# Patient Record
Sex: Female | Born: 1975 | Race: White | Hispanic: No | Marital: Single | Smoking: Current every day smoker
Health system: Southern US, Community
[De-identification: ages and names within clinical notes are randomized; demographics above are authoritative.]

## PROBLEM LIST (undated history)

## (undated) DIAGNOSIS — G43909 Migraine, unspecified, not intractable, without status migrainosus: Secondary | ICD-10-CM

## (undated) DIAGNOSIS — H9190 Unspecified hearing loss, unspecified ear: Secondary | ICD-10-CM

## (undated) HISTORY — DX: Migraine, unspecified, not intractable, without status migrainosus: G43.909

## (undated) HISTORY — PX: OTHER SURGICAL HISTORY: SHX169

## (undated) HISTORY — PX: TONSILLECTOMY AND ADENOIDECTOMY: SUR1326

## (undated) HISTORY — PX: CHOLECYSTECTOMY: SHX55

## (undated) HISTORY — DX: Unspecified hearing loss, unspecified ear: H91.90

## (undated) HISTORY — PX: DILATION AND CURETTAGE OF UTERUS: SHX78

---

## 2020-02-06 ENCOUNTER — Emergency Department (INDEPENDENT_AMBULATORY_CARE_PROVIDER_SITE_OTHER): Payer: Self-pay

## 2020-02-06 ENCOUNTER — Emergency Department (INDEPENDENT_AMBULATORY_CARE_PROVIDER_SITE_OTHER)
Admission: EM | Admit: 2020-02-06 | Discharge: 2020-02-06 | Disposition: A | Discharge status: Home / Self Care | Payer: Self-pay | Source: Home / Self Care

## 2020-02-06 DIAGNOSIS — W010XXA Fall on same level from slipping, tripping and stumbling without subsequent striking against object, initial encounter: Secondary | ICD-10-CM

## 2020-02-06 DIAGNOSIS — S60416A Abrasion of right little finger, initial encounter: Secondary | ICD-10-CM

## 2020-02-06 DIAGNOSIS — M79641 Pain in right hand: Secondary | ICD-10-CM

## 2020-02-06 DIAGNOSIS — Y990 Civilian activity done for income or pay: Secondary | ICD-10-CM

## 2020-02-06 DIAGNOSIS — S63616A Unspecified sprain of right little finger, initial encounter: Secondary | ICD-10-CM

## 2020-02-06 DIAGNOSIS — S60414A Abrasion of right ring finger, initial encounter: Secondary | ICD-10-CM

## 2020-02-06 DIAGNOSIS — S60511A Abrasion of right hand, initial encounter: Secondary | ICD-10-CM

## 2020-02-06 NOTE — ED Triage Notes (Signed)
Pt c/o pain in her RT hand, 4th and pinkie fingers. Says she heard two snaps when she fell about 430 while at work. Has previously broken those fingers. Pain 7/10

## 2020-02-06 NOTE — Discharge Instructions (Signed)
  Keep wounds clean with warm water and mild soap. Change bandage 1-2 times daily to help keep protected as the wounds heal.  You may take tylenol and motrin as needed for pain.  Follow up with employee health and wellness at [facility] as needed.

## 2020-02-06 NOTE — ED Provider Notes (Signed)
[facility]    CSN: [id number] Arrival date & time: 02/06/20  1752      History   Chief Complaint Chief Complaint  Patient presents with  . Hand Pain    4th and pinkie finger    HPI Denise Mckinney is a 45 y.o. female.   HPI  Denise Mckinney is a 45 y.o. female presenting to UC with c/o Right hand pain, worse in little finger and ring finger following trip and fall at work, landing on cement. She has abrasions to tips of her fingers and hands. Denies hitting her head during the fall. Pain is 7/10. Hx of fracture in same fingers about 6 years ago from a fall.    History reviewed. No pertinent past medical history.  There are no problems to display for this patient.   History reviewed. No pertinent surgical history.  OB History   No obstetric history on file.      Home Medications    Prior to Admission medications   Not on File    Family History History reviewed. No pertinent family history.  Social History Social History   Tobacco Use  . Smoking status: Current Every Day Smoker    Packs/day: 0.50  . Smokeless tobacco: Never Used  Vaping Use  . Vaping Use: Never used  Substance Use Topics  . Alcohol use: Yes    Comment: occ     Allergies   Patient has no known allergies.   Review of Systems Review of Systems  Musculoskeletal: Positive for arthralgias and joint swelling.  Skin: Positive for wound. Negative for color change.  Neurological: Negative for dizziness, weakness, light-headedness, numbness and headaches.     Physical Exam Triage Vital Signs ED Triage Vitals  Enc Vitals Group     BP 02/06/20 1806 137/83     Pulse Rate 02/06/20 1806 71     Resp 02/06/20 1806 18     Temp 02/06/20 1806 97.7 F (36.5 C)     Temp Source 02/06/20 1806 Oral     SpO2 02/06/20 1806 98 %     Weight --      Height --      Head Circumference --      Peak Flow --      Pain Score 02/06/20 1807 7     Pain Loc --      Pain Edu? --      Excl. in [place]?  --    No data found.  Updated Vital Signs BP 137/83 (BP Location: Right Arm)   Pulse 71   Temp 97.7 F (36.5 C) (Oral)   Resp 18   SpO2 98%   Visual Acuity Right Eye Distance:   Left Eye Distance:   Bilateral Distance:    Right Eye Near:   Left Eye Near:    Bilateral Near:     Physical Exam Vitals and nursing note reviewed.  Constitutional:      Appearance: Normal appearance. She is well-developed and well-nourished.  HENT:     Head: Normocephalic and atraumatic.  Eyes:     Extraocular Movements: EOM normal.  Cardiovascular:     Rate and Rhythm: Normal rate and regular rhythm.     Pulses:          Radial pulses are 2+ on the right side.  Pulmonary:     Effort: Pulmonary effort is normal.  Musculoskeletal:        General: Tenderness present. No swelling. Normal range of motion.  Cervical back: Normal range of motion.     Comments: Right wrist: non-tender, full ROM. Right hand: mild tenderness over fourth and fifth metacarpals, little finger and ring finger. Full ROM, increased pain with full flexion of little and ring fingers.  Skin:    General: Skin is warm and dry.     Capillary Refill: Capillary refill takes less than 2 seconds.     Comments: Right hand: multiple superficial abrasions including tips of little finger and ring finger.   Neurological:     Mental Status: She is alert and oriented to person, place, and time.     Sensory: No sensory deficit.  Psychiatric:        Mood and Affect: Mood and affect normal.        Behavior: Behavior normal.      UC Treatments / Results  Labs (all labs ordered are listed, but only abnormal results are displayed) Labs Reviewed - No data to display  EKG   Radiology DG Hand Complete Right  Result Date: 02/06/2020 CLINICAL DATA:  45 year old female with fall and trauma to the right hand. EXAM: RIGHT HAND - COMPLETE 3+ VIEW COMPARISON:  None. FINDINGS: There is no acute fracture or dislocation. The bones are  osteopenic. The soft tissues are unremarkable. IMPRESSION: No acute fracture or dislocation. Electronically Signed   By: Anner Crete M.D.   On: 02/06/2020 18:40    Procedures Procedures (including critical care time)  Medications Ordered in UC Medications - No data to display  Initial Impression / Assessment and Plan / UC Course  I have reviewed the triage vital signs and the nursing notes.  Pertinent labs & imaging results that were available during my care of the patient were reviewed by me and considered in my medical decision making (see chart for details).     Discussed imaging with pt Will tx as finger sprain of little finger where pain is worst. Fingers strapped using buddy taping technique F/u with Employee Health as needed AVS given  Final Clinical Impressions(s) / UC Diagnoses   Final diagnoses:  Fall from slip, trip, or stumble, initial encounter  Abrasion of right little finger, initial encounter  Abrasion of right ring finger, initial encounter  Abrasion of right hand, initial encounter  Sprain of right little finger, initial encounter  Work related injury     Discharge Instructions      Keep wounds clean with warm water and mild soap. Change bandage 1-2 times daily to help keep protected as the wounds heal.  You may take tylenol and motrin as needed for pain.  Follow up with employee health and wellness at [facility] as needed.    ED Prescriptions    None     PDMP not reviewed this encounter.   Noe Gens, [place] 02/06/20 2007

## 2020-02-27 ENCOUNTER — Other Ambulatory Visit: Payer: Self-pay

## 2020-02-27 ENCOUNTER — Encounter: Payer: Self-pay | Admitting: Nurse Practitioner

## 2020-02-27 ENCOUNTER — Ambulatory Visit (INDEPENDENT_AMBULATORY_CARE_PROVIDER_SITE_OTHER): Payer: 59 | Admitting: Nurse Practitioner

## 2020-02-27 VITALS — BP 114/71 | HR 63 | Temp 98.3°F | Ht 62.0 in | Wt 119.4 lb

## 2020-02-27 DIAGNOSIS — G43009 Migraine without aura, not intractable, without status migrainosus: Secondary | ICD-10-CM | POA: Diagnosis not present

## 2020-02-27 DIAGNOSIS — Z8669 Personal history of other diseases of the nervous system and sense organs: Secondary | ICD-10-CM | POA: Insufficient documentation

## 2020-02-27 NOTE — Assessment & Plan Note (Addendum)
Patient has a history of migraine without aura and without status migrainous, not intractable. No new migraine symptoms.  Patient well managed with over-the-counter pain medication.

## 2020-02-27 NOTE — Assessment & Plan Note (Signed)
Bilateral history of hearing loss is a chronic symptom for patient. Patient does not use hearing aids. Patient has been referred to ENT for reassessment.  Follow-up with worsening or unresolved symptoms.

## 2020-02-27 NOTE — Patient Instructions (Signed)
Make appointment for complete physical exam on your next visit.  We will be doing a Pap, orders for mammograms, and labs-CBC, CMP, lipid panel and  hepatitis C,  Hearing Loss Hearing loss is a partial or total loss of the ability to hear. This can be temporary or permanent, and it can happen in one or both ears. Medical care is necessary to treat hearing loss properly and to prevent the condition from getting worse. Your hearing may partially or completely come back, depending on what caused your hearing loss and how severe it is. In some cases, hearing loss is permanent. What are the causes? Common causes of hearing loss include:  Too much wax in the ear canal.  Infection of the ear canal or middle ear.  Fluid in the middle ear.  Injury to the ear or surrounding area.  An object stuck in the ear.  A history of prolonged exposure to loud sounds, such as music. Less common causes of hearing loss include:  Tumors in the ear.  Viral or bacterial infections, such as meningitis.  A hole in the eardrum (perforated eardrum).  Problems with the hearing nerve that sends signals between the brain and the ear.  Certain medicines. What are the signs or symptoms? Symptoms of this condition may include:  Difficulty telling the difference between sounds.  Difficulty following a conversation when there is background noise.  Lack of response to sounds in your environment. This may be most noticeable when you do not respond to startling sounds.  Needing to turn up the volume on the television, radio, or other devices.  Ringing in the ears.  Dizziness. How is this diagnosed? This condition is diagnosed based on:  A physical exam.  A hearing test (audiometry). The audiometry test will be performed by a hearing specialist (audiologist). You may also be referred to an ear, nose, and throat (ENT) specialist (otolaryngologist).   How is this treated? Treatment for hearing loss may  include:  Ear wax removal.  Medicines to treat or prevent infection (antibiotics).  Medicines to reduce inflammation (corticosteroids).  Hearing aids for hearing loss related to nerve damage. Follow these instructions at home:  If you were prescribed an antibiotic medicine, take it as told by your health care provider. Do not stop taking the antibiotic even if you start to feel better.  Take over-the-counter and prescription medicines only as told by your health care provider.  Avoid loud noises.  Return to your normal activities as told by your health care provider. Ask your health care provider what activities are safe for you.  Keep all follow-up visits as told by your health care provider. This is important. Contact a health care provider if:  You feel dizzy.  You develop new symptoms.  You vomit or feel nauseous.  You have a fever. Get help right away if:  You develop sudden changes in your vision.  You have severe ear pain.  You have new or increased weakness.  You have a severe headache. Summary  Hearing loss is a decreased ability to hear sounds around you. It can be temporary or permanent.  Treatment will depend on the cause of your hearing loss. It may include ear wax removal, medicines, or a hearing aid.  Your hearing may partially or completely come back, depending on what caused your hearing loss and how severe it is.  Keep all follow-up visits as told by your health care provider. This is important. This information is not intended to  replace advice given to you by your health care provider. Make sure you discuss any questions you have with your health care provider. Document Revised: 09/22/2017 Document Reviewed: 09/22/2017 Elsevier Patient Education  2021 [place]. Migraine Headache A migraine headache is a very strong throbbing pain on one side or both sides of your head. This type of headache can also cause other symptoms. It can last from 4  hours to 3 days. Talk with your doctor about what things may bring on (trigger) this condition. What are the causes? The exact cause of this condition is not known. This condition may be triggered or caused by:  Drinking alcohol.  Smoking.  Taking medicines, such as: ? Medicine used to treat chest pain (nitroglycerin). ? Birth control pills. ? Estrogen. ? Some blood pressure medicines.  Eating or drinking certain products.  Doing physical activity. Other things that may trigger a migraine headache include:  Having a menstrual period.  Pregnancy.  Hunger.  Stress.  Not getting enough sleep or getting too much sleep.  Weather changes.  Tiredness (fatigue). What increases the risk?  Being 73-25 years old.  Being female.  Having a family history of migraine headaches.  Being Caucasian.  Having depression or anxiety.  Being very overweight. What are the signs or symptoms?  A throbbing pain. This pain may: ? Happen in any area of the head, such as on one side or both sides. ? Make it hard to do daily activities. ? Get worse with physical activity. ? Get worse around bright lights or loud noises.  Other symptoms may include: ? Feeling sick to your stomach (nauseous). ? Vomiting. ? Dizziness. ? Being sensitive to bright lights, loud noises, or smells.  Before you get a migraine headache, you may get warning signs (an aura). An aura may include: ? Seeing flashing lights or having blind spots. ? Seeing bright spots, halos, or zigzag lines. ? Having tunnel vision or blurred vision. ? Having numbness or a tingling feeling. ? Having trouble talking. ? Having weak muscles.  Some people have symptoms after a migraine headache (postdromal phase), such as: ? Tiredness. ? Trouble thinking (concentrating). How is this treated?  Taking medicines that: ? Relieve pain. ? Relieve the feeling of being sick to your stomach. ? Prevent migraine headaches.  Treatment  may also include: ? Having acupuncture. ? Avoiding foods that bring on migraine headaches. ? Learning ways to control your body functions (biofeedback). ? Therapy to help you know and deal with negative thoughts (cognitive behavioral therapy). Follow these instructions at home: Medicines  Take over-the-counter and prescription medicines only as told by your doctor.  Ask your doctor if the medicine prescribed to you: ? Requires you to avoid driving or using heavy machinery. ? Can cause trouble pooping (constipation). You may need to take these steps to prevent or treat trouble pooping:  Drink enough fluid to keep your pee (urine) pale yellow.  Take over-the-counter or prescription medicines.  Eat foods that are high in fiber. These include beans, whole grains, and fresh fruits and vegetables.  Limit foods that are high in fat and sugar. These include fried or sweet foods. Lifestyle  Do not drink alcohol.  Do not use any products that contain nicotine or tobacco, such as cigarettes, e-cigarettes, and chewing tobacco. If you need help quitting, ask your doctor.  Get at least 8 hours of sleep every night.  Limit and deal with stress. General instructions  Keep a journal to find out what  may bring on your migraine headaches. For example, write down: ? What you eat and drink. ? How much sleep you get. ? Any change in what you eat or drink. ? Any change in your medicines.  If you have a migraine headache: ? Avoid things that make your symptoms worse, such as bright lights. ? It may help to lie down in a dark, quiet room. ? Do not drive or use heavy machinery. ? Ask your doctor what activities are safe for you.  Keep all follow-up visits as told by your doctor. This is important.      Contact a doctor if:  You get a migraine headache that is different or worse than others you have had.  You have more than 15 headache days in one month. Get help right away if:  Your  migraine headache gets very bad.  Your migraine headache lasts longer than 72 hours.  You have a fever.  You have a stiff neck.  You have trouble seeing.  Your muscles feel weak or like you cannot control them.  You start to lose your balance a lot.  You start to have trouble walking.  You pass out (faint).  You have a seizure. Summary  A migraine headache is a very strong throbbing pain on one side or both sides of your head. These headaches can also cause other symptoms.  This condition may be treated with medicines and changes to your lifestyle.  Keep a journal to find out what may bring on your migraine headaches.  Contact a doctor if you get a migraine headache that is different or worse than others you have had.  Contact your doctor if you have more than 15 headache days in a month. This information is not intended to replace advice given to you by your health care provider. Make sure you discuss any questions you have with your health care provider. Document Revised: 04/16/2018 Document Reviewed: 02/04/2018 Elsevier Patient Education  [place].

## 2020-02-27 NOTE — Progress Notes (Signed)
New Patient Note  RE: Denise Mckinney MRN: [id number] DOB: 08-May-1975 Date of Office Visit: 02/27/2020  Chief Complaint: Establish Care, Migraine, and Hearing Loss  History of Present Illness:   Hearing Loss: Patient presents with bilateral hearing loss. This is not new for patient but a chronic problem. Patient currently sees ENT doctor. Patient reports she recently relocated and has no new ENT doctor. Patient would like to be referred to get ears reassessed as she is currently experiencing mild worsening hearing loss.  Migraine  This is a chronic problem. The current episode started more than 1 year ago. The problem has been gradually improving. The pain does not radiate. The quality of the pain is described as aching. The pain is moderate. Associated symptoms include hearing loss. Pertinent negatives include no abdominal pain, blurred vision, coughing, drainage, eye redness or fever. She has tried nothing for the symptoms. Her past medical history is significant for migraine headaches.       Assessment and Plan: Denise Mckinney is a 45 y.o. female with: Migraine without aura and without status migrainosus, not intractable Patient has a history of migraine without aura and without status migrainous, not intractable. No new migraine symptoms.  Patient well managed with over-the-counter pain medication.   History of hearing loss Bilateral history of hearing loss is a chronic symptom for patient. Patient does not use hearing aids. Patient has been referred to ENT for reassessment.  Follow-up with worsening or unresolved symptoms.  No follow-ups on file.   Diagnostics:   Past Medical History: Patient Active Problem List   Diagnosis Date Noted  . History of hearing loss 02/27/2020  . Migraine without aura and without status migrainosus, not intractable 02/27/2020   Past Medical History:  Diagnosis Date  . Hearing loss   . Migraines    Past Surgical History: Past Surgical History:   Procedure Laterality Date  . 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 CHOLECYSTECTOMY    . DILATION AND CURETTAGE OF UTERUS    . pins in right ring and small fingers    . TONSILLECTOMY AND ADENOIDECTOMY    . tubes in ears    . tumor removed under right eye     Medication List:  No current outpatient medications on file.   No current facility-administered medications for this visit.   Allergies: No Known Allergies Social History: Social History   Socioeconomic History  . Marital status: Single    Spouse name: Not on file  . Number of children: 3  . Years of education: Not on file  . Highest education level: Some college, no degree  Occupational History  . Not on file  Tobacco Use  . Smoking status: Current Every Day Smoker    Packs/day: 0.50    Years: 36.00    Pack years: 18.00  . Smokeless tobacco: Never Used  Vaping Use  . Vaping Use: Never used  Substance and Sexual Activity  . Alcohol use: Yes    Comment: occ  . Drug use: Never  . Sexual activity: Not Currently  Other Topics Concern  . Not on file  Social History Narrative  . Not on file   Social Determinants of Health   Financial Resource Strain: Not on file  Food Insecurity: Not on file  Transportation Needs: Not on file  Physical Activity: Not on file  Stress: Not on file  Social Connections: Not on file       Family History: Family History  Problem  Relation Age of Onset  . Heart disease Mother   . Hyperlipidemia Mother   . Hypertension Mother   . Diabetes Mother   . Hypertension Father   . Cancer Father        lung and esophagus  . Diabetes Sister   . Heart disease Sister   . Bipolar disorder Daughter          Review of Systems  Constitutional: Negative for fever.  HENT: Positive for hearing loss.   Eyes: Negative for blurred vision and redness.  Respiratory: Negative for cough.   Gastrointestinal: Negative for abdominal pain.  Genitourinary: Negative.   Musculoskeletal:  Negative.   All other systems reviewed and are negative.  Objective: BP 114/71   Pulse 63   Temp 98.3 F (36.8 C)   Ht 5' 2"  (1.575 m)   Wt 119 lb 6.4 oz (54.2 kg)   SpO2 97%   BMI 21.84 kg/m  Body mass index is 21.84 kg/m. Physical Exam Vitals reviewed.  Constitutional:      Appearance: Normal appearance.  HENT:     Head: Normocephalic.     Right Ear: There is no impacted cerumen.     Left Ear: There is no impacted cerumen.     Ears:     Comments: Bilateral hearing loss    Nose: Nose normal.  Eyes:     Conjunctiva/sclera: Conjunctivae normal.  Cardiovascular:     Rate and Rhythm: Normal rate and regular rhythm.  Pulmonary:     Effort: Pulmonary effort is normal.     Breath sounds: Normal breath sounds.  Abdominal:     General: Bowel sounds are normal.  Musculoskeletal:        General: Normal range of motion.  Skin:    General: Skin is warm.  Neurological:     Mental Status: She is alert and oriented to person, place, and time.  Psychiatric:        Behavior: Behavior normal.    The plan was reviewed with the patient/family, and all questions/concerned were addressed.  It was my pleasure to see Denise Mckinney today and participate in her care. Please feel free to contact me with any questions or concerns.  Sincerely,  Jac Canavan NP [place]

## 2020-03-09 ENCOUNTER — Ambulatory Visit (INDEPENDENT_AMBULATORY_CARE_PROVIDER_SITE_OTHER): Payer: 59 | Admitting: Nurse Practitioner

## 2020-03-09 ENCOUNTER — Other Ambulatory Visit (HOSPITAL_COMMUNITY)
Admission: RE | Admit: 2020-03-09 | Discharge: 2020-03-09 | Disposition: A | Discharge status: Home / Self Care | Payer: 59 | Source: Ambulatory Visit | Attending: Nurse Practitioner | Admitting: Nurse Practitioner

## 2020-03-09 ENCOUNTER — Encounter: Payer: Self-pay | Admitting: Nurse Practitioner

## 2020-03-09 ENCOUNTER — Other Ambulatory Visit: Payer: Self-pay

## 2020-03-09 VITALS — BP 110/68 | HR 69 | Temp 97.6°F | Ht 62.0 in | Wt 120.4 lb

## 2020-03-09 DIAGNOSIS — Z0001 Encounter for general adult medical examination with abnormal findings: Secondary | ICD-10-CM

## 2020-03-09 DIAGNOSIS — Z0000 Encounter for general adult medical examination without abnormal findings: Secondary | ICD-10-CM | POA: Insufficient documentation

## 2020-03-09 DIAGNOSIS — R102 Pelvic and perineal pain: Secondary | ICD-10-CM | POA: Diagnosis not present

## 2020-03-09 MED ORDER — NAPROXEN 500 MG PO TABS: 500.0000 mg | ORAL_TABLET | Freq: Two times a day (BID) | ORAL | 0 refills | Status: DC

## 2020-03-09 NOTE — Patient Instructions (Signed)
Health Maintenance, Female Adopting a healthy lifestyle and getting preventive care are important in promoting health and wellness. Ask your health care provider about:  The right schedule for you to have regular tests and exams.  Things you can do on your own to prevent diseases and keep yourself healthy. What should I know about diet, weight, and exercise? Eat a healthy diet  Eat a diet that includes plenty of vegetables, fruits, low-fat dairy products, and lean protein.  Do not eat a lot of foods that are high in solid fats, added sugars, or sodium.   Maintain a healthy weight Body mass index (BMI) is used to identify weight problems. It estimates body fat based on height and weight. Your health care provider can help determine your BMI and help you achieve or maintain a healthy weight. Get regular exercise Get regular exercise. This is one of the most important things you can do for your health. Most adults should:  Exercise for at least 150 minutes each week. The exercise should increase your heart rate and make you sweat (moderate-intensity exercise).  Do strengthening exercises at least twice a week. This is in addition to the moderate-intensity exercise.  Spend less time sitting. Even light physical activity can be beneficial. Watch cholesterol and blood lipids Have your blood tested for lipids and cholesterol at 47 years of age, then have this test every 5 years. Have your cholesterol levels checked more often if:  Your lipid or cholesterol levels are high.  You are older than 47 years of age.  You are at high risk for heart disease. What should I know about cancer screening? Depending on your health history and family history, you may need to have cancer screening at various ages. This may include screening for:  Breast cancer.  Cervical cancer.  Colorectal cancer.  Skin cancer.  Lung cancer. What should I know about heart disease, diabetes, and high blood  pressure? Blood pressure and heart disease  High blood pressure causes heart disease and increases the risk of stroke. This is more likely to develop in people who have high blood pressure readings, are of African descent, or are overweight.  Have your blood pressure checked: ? Every 3-5 years if you are 18-39 years of age. ? Every year if you are 40 years old or older. Diabetes Have regular diabetes screenings. This checks your fasting blood sugar level. Have the screening done:  Once every three years after age 40 if you are at a normal weight and have a low risk for diabetes.  More often and at a younger age if you are overweight or have a high risk for diabetes. What should I know about preventing infection? Hepatitis B If you have a higher risk for hepatitis B, you should be screened for this virus. Talk with your health care provider to find out if you are at risk for hepatitis B infection. Hepatitis C Testing is recommended for:  Everyone born from 1945 through 1965.  Anyone with known risk factors for hepatitis C. Sexually transmitted infections (STIs)  Get screened for STIs, including gonorrhea and chlamydia, if: ? You are sexually active and are younger than 47 years of age. ? You are older than 47 years of age and your health care provider tells you that you are at risk for this type of infection. ? Your sexual activity has changed since you were last screened, and you are at increased risk for chlamydia or gonorrhea. Ask your health care provider   if you are at risk.  Ask your health care provider about whether you are at high risk for HIV. Your health care provider may recommend a prescription medicine to help prevent HIV infection. If you choose to take medicine to prevent HIV, you should first get tested for HIV. You should then be tested every 3 months for as long as you are taking the medicine. Pregnancy  If you are about to stop having your period (premenopausal) and  you may become pregnant, seek counseling before you get pregnant.  Take 400 to 800 micrograms (mcg) of folic acid every day if you become pregnant.  Ask for birth control (contraception) if you want to prevent pregnancy. Osteoporosis and menopause Osteoporosis is a disease in which the bones lose minerals and strength with aging. This can result in bone fractures. If you are 65 years old or older, or if you are at risk for osteoporosis and fractures, ask your health care provider if you should:  Be screened for bone loss.  Take a calcium or vitamin D supplement to lower your risk of fractures.  Be given hormone replacement therapy (HRT) to treat symptoms of menopause. Follow these instructions at home: Lifestyle  Do not use any products that contain nicotine or tobacco, such as cigarettes, e-cigarettes, and chewing tobacco. If you need help quitting, ask your health care provider.  Do not use street drugs.  Do not share needles.  Ask your health care provider for help if you need support or information about quitting drugs. Alcohol use  Do not drink alcohol if: ? Your health care provider tells you not to drink. ? You are pregnant, may be pregnant, or are planning to become pregnant.  If you drink alcohol: ? Limit how much you use to 0-1 drink a day. ? Limit intake if you are breastfeeding.  Be aware of how much alcohol is in your drink. In the [place] one drink equals one 12 oz bottle of beer (355 mL), one 5 oz glass of wine (148 mL), or one 1 oz glass of hard liquor (44 mL). General instructions  Schedule regular health, dental, and eye exams.  Stay current with your vaccines.  Tell your health care provider if: ? You often feel depressed. ? You have ever been abused or do not feel safe at home. Summary  Adopting a healthy lifestyle and getting preventive care are important in promoting health and wellness.  Follow your health care provider's instructions about healthy  diet, exercising, and getting tested or screened for diseases.  Follow your health care provider's instructions on monitoring your cholesterol and blood pressure. This information is not intended to replace advice given to you by your health care provider. Make sure you discuss any questions you have with your health care provider. Document Revised: 12/16/2017 Document Reviewed: 12/16/2017 Elsevier Patient Education  2021 [employer]  

## 2020-03-09 NOTE — Assessment & Plan Note (Signed)
This is not new for patient.  Patient reports pelvic pain has been ongoing and has talked to former  provider who suggested using birth control pills to help with ovarian cyst that may be radiating to pelvic.  Patient is not ready to start birth control pills.  Naproxen 500 mg started every 12 hours as needed for pain.  Education provided with printed handouts given   Follow-up with worsening unresolved symptoms  Rx sent to pharmacy

## 2020-03-09 NOTE — Assessment & Plan Note (Signed)
Completed annual physical exam.  Provided health maintenance and preventative care education with printed handouts given.  Follow-up in 1 year Labs completed CBC, CMP, lipid panel, HIV screening, hepatitis C, Pap.

## 2020-03-09 NOTE — Progress Notes (Signed)
Established Patient Office Visit  Subjective:  Patient ID: Denise Mckinney, female    DOB: November 16, 1975  Age: 45 y.o. MRN: [id number]  CC:  Chief Complaint  Patient presents with  . Annual Exam    HPI Denise Mckinney presents for encounter for general adult medical examination without abnormal findings  Physical: Patient's last physical exam was 1 year ago .  Weight: Appropriate for height (BMI less than 27%) ; yes Blood Pressure: Normal (BP less than 120/80) ; yes Medical History: Patient history reviewed ; Family history reviewed ;  Allergies Reviewed: No change in current allergies ;  Medications Reviewed: Medications reviewed - no changes ;  Lipids: Normal lipid levels ; labs completed Smoking: Life-long non-smoker ;  Physical Activity: Exercises at least 3 times per week ;  Alcohol/Drug Use: Is a non-drinker ; No illicit drug use ;  Patient is not afflicted from Stress Incontinence and Urge Incontinence  Safety: reviewed ; Patient wears a seat belt, has smoke detectors, has carbon monoxide detectors, and wears sunscreen with extended sun exposure. Dental Care: biannual cleanings, brushes and flosses daily. Ophthalmology/Optometry: Annual visit.  Hearing loss: none Vision impairments: none Past Medical History:  Diagnosis Date  . Hearing loss   . Migraines     Past Surgical History:  Procedure Laterality Date  . 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 CHOLECYSTECTOMY    . DILATION AND CURETTAGE OF UTERUS    . pins in right ring and small fingers    . TONSILLECTOMY AND ADENOIDECTOMY    . tubes in ears    . tumor removed under right eye      Family History  Problem Relation Age of Onset  . Heart disease Mother   . Hyperlipidemia Mother   . Hypertension Mother   . Diabetes Mother   . Hypertension Father   . Cancer Father        lung and esophagus  . Diabetes Sister   . Heart disease Sister   . Bipolar disorder Daughter     Social History   Socioeconomic  History  . Marital status: Single    Spouse name: Not on file  . Number of children: 3  . Years of education: Not on file  . Highest education level: Some college, no degree  Occupational History  . Not on file  Tobacco Use  . Smoking status: Current Every Day Smoker    Packs/day: 0.50    Years: 36.00    Pack years: 18.00  . Smokeless tobacco: Never Used  Vaping Use  . Vaping Use: Never used  Substance and Sexual Activity  . Alcohol use: Yes    Comment: occ  . Drug use: Never  . Sexual activity: Not Currently  Other Topics Concern  . Not on file  Social History Narrative  . Not on file   Social Determinants of Health   Financial Resource Strain: Not on file  Food Insecurity: Not on file  Transportation Needs: Not on file  Physical Activity: Not on file  Stress: Not on file  Social Connections: Not on file  Intimate Partner Violence: Not on file    No outpatient medications prior to visit.   No facility-administered medications prior to visit.    No Known Allergies  ROS Review of Systems  Constitutional: Negative.   HENT: Negative.   Respiratory: Negative.   Cardiovascular: Negative.   Gastrointestinal: Negative.   Endocrine: Negative.   Genitourinary: Negative.   Musculoskeletal:  Pelvic pain  Skin: Negative.   Neurological: Negative.   Psychiatric/Behavioral: Negative.   All other systems reviewed and are negative.     Objective:    Physical Exam Vitals reviewed.  Constitutional:      General: She is awake.     Appearance: Normal appearance. She is well-groomed.     Interventions: Face mask in place.  HENT:     Head: Normocephalic.     Nose: Nose normal.     Mouth/Throat:     Mouth: Mucous membranes are moist.     Pharynx: Oropharynx is clear. No oropharyngeal exudate or posterior oropharyngeal erythema.  Eyes:     Conjunctiva/sclera: Conjunctivae normal.  Cardiovascular:     Rate and Rhythm: Normal rate and regular rhythm.      Pulses: Normal pulses.     Heart sounds: Normal heart sounds.  Pulmonary:     Effort: Pulmonary effort is normal.     Breath sounds: Normal breath sounds.  Abdominal:     General: Bowel sounds are normal.  Genitourinary:    General: Normal vulva.     Vagina: No vaginal discharge.     Rectum: Normal.  Musculoskeletal:        General: Tenderness present.  Skin:    General: Skin is warm.  Neurological:     Mental Status: She is alert and oriented to person, place, and time.  Psychiatric:        Mood and Affect: Mood normal.        Behavior: Behavior normal. Behavior is cooperative.     BP 110/68   Pulse 69   Temp 97.6 F (36.4 C)   Ht 5' 2"  (1.575 m)   Wt 120 lb 6.4 oz (54.6 kg)   SpO2 100%   BMI 22.02 kg/m  Wt Readings from Last 3 Encounters:  03/09/20 120 lb 6.4 oz (54.6 kg)  02/27/20 119 lb 6.4 oz (54.2 kg)     Health Maintenance Due  Topic Date Due  . Hepatitis C Screening  Never done  . HIV Screening  Never done  . PAP SMEAR-Modifier  Never done  . COLONOSCOPY (Pts 45-108yr Insurance coverage will need to be confirmed)  Never done      Assessment & Plan:   Problem List Items Addressed This Visit      Other   Annual physical exam - Primary    Completed annual physical exam.  Provided health maintenance and preventative care education with printed handouts given.  Follow-up in 1 year Labs completed CBC, CMP, lipid panel, HIV screening, hepatitis C, Pap.      Relevant Orders   CBC with Differential   Comprehensive metabolic panel   Lipid Panel   HIV antibody (with reflex)   Hepatitis C antibody   MM Digital Screening   Cytology - PAP([facility])   Cologuard   Pelvic pain    This is not new for patient.  Patient reports pelvic pain has been ongoing and has talked to former  provider who suggested using birth control pills to help with ovarian cyst that may be radiating to pelvic.  Patient is not ready to start birth control pills.  Naproxen 500 mg  started every 12 hours as needed for pain.  Education provided with printed handouts given   Follow-up with worsening unresolved symptoms  Rx sent to pharmacy      Relevant Medications   naproxen (NAPROSYN) 500 MG tablet        Follow-up: Return  in about 1 year (around 03/09/2021).    Ivy Lynn, NP

## 2020-03-10 LAB — COMPREHENSIVE METABOLIC PANEL
ALT: 12 IU/L (ref 0–32)
AST: 20 IU/L (ref 0–40)
Albumin/Globulin Ratio: 2 (ref 1.2–2.2)
Albumin: 4.4 g/dL (ref 3.8–4.8)
Alkaline Phosphatase: 105 IU/L (ref 44–121)
BUN/Creatinine Ratio: 15 (ref 9–23)
BUN: 12 mg/dL (ref 6–24)
Bilirubin Total: 0.4 mg/dL (ref 0.0–1.2)
CO2: 25 mmol/L (ref 20–29)
Calcium: 9.3 mg/dL (ref 8.7–10.2)
Chloride: 101 mmol/L (ref 96–106)
Creatinine, Ser: 0.81 mg/dL (ref 0.57–1.00)
Globulin, Total: 2.2 g/dL (ref 1.5–4.5)
Glucose: 76 mg/dL (ref 65–99)
Potassium: 3.9 mmol/L (ref 3.5–5.2)
Sodium: 140 mmol/L (ref 134–144)
Total Protein: 6.6 g/dL (ref 6.0–8.5)
eGFR: 91 mL/min/{1.73_m2} (ref >59)

## 2020-03-10 LAB — LIPID PANEL
Chol/HDL Ratio: 3.7 ratio (ref 0.0–4.4)
Cholesterol, Total: 187 mg/dL (ref 100–199)
HDL: 51 mg/dL (ref >39)
LDL Chol Calc (NIH): 121 mg/dL — ABNORMAL HIGH (ref 0–99)
Triglycerides: 82 mg/dL (ref 0–149)
VLDL Cholesterol Cal: 15 mg/dL (ref 5–40)

## 2020-03-10 LAB — HEPATITIS C ANTIBODY: Hep C Virus Ab: <0.1 s/co ratio (ref 0.0–0.9)

## 2020-03-10 LAB — CBC WITH DIFFERENTIAL/PLATELET
Basophils Absolute: 0 10*3/uL (ref 0.0–0.2)
Basos: 0 %
EOS (ABSOLUTE): 0.2 10*3/uL (ref 0.0–0.4)
Eos: 2 %
Hematocrit: 48.1 % — ABNORMAL HIGH (ref 34.0–46.6)
Hemoglobin: 16.1 g/dL — ABNORMAL HIGH (ref 11.1–15.9)
Immature Grans (Abs): 0 10*3/uL (ref 0.0–0.1)
Immature Granulocytes: 0 %
Lymphocytes Absolute: 3.8 10*3/uL — ABNORMAL HIGH (ref 0.7–3.1)
Lymphs: 53 %
MCH: 31.5 pg (ref 26.6–33.0)
MCHC: 33.5 g/dL (ref 31.5–35.7)
MCV: 94 fL (ref 79–97)
Monocytes Absolute: 0.5 10*3/uL (ref 0.1–0.9)
Monocytes: 7 %
Neutrophils Absolute: 2.7 10*3/uL (ref 1.4–7.0)
Neutrophils: 38 %
Platelets: 177 10*3/uL (ref 150–450)
RBC: 5.11 x10E6/uL (ref 3.77–5.28)
RDW: 11.9 % (ref 11.7–15.4)
WBC: 7.2 10*3/uL (ref 3.4–10.8)

## 2020-03-10 LAB — HIV ANTIBODY (ROUTINE TESTING W REFLEX): HIV Screen 4th Generation wRfx: NONREACTIVE

## 2020-03-14 LAB — CYTOLOGY - PAP
Adequacy: ABSENT
Diagnosis: NEGATIVE

## 2020-03-19 ENCOUNTER — Other Ambulatory Visit: Payer: Self-pay | Admitting: Nurse Practitioner

## 2020-03-19 ENCOUNTER — Telehealth: Payer: Self-pay

## 2020-03-19 DIAGNOSIS — Z720 Tobacco use: Secondary | ICD-10-CM | POA: Insufficient documentation

## 2020-03-19 MED ORDER — BUPROPION HCL ER (SMOKING DET) 150 MG PO TB12: 150.0000 mg | ORAL_TABLET | Freq: Two times a day (BID) | ORAL | 2 refills | Status: DC

## 2020-03-19 NOTE — Telephone Encounter (Signed)
Pt called stating that she spoke with Je at her last visit about possibly getting started on Welbutrin to help her quit smoking. Pt says she is ready and would like Je to send in Rx of Welbutrin for her to take while quitting smoking.  Please advise and call patient.

## 2020-03-19 NOTE — Telephone Encounter (Signed)
Lmtcb.

## 2020-03-19 NOTE — Telephone Encounter (Signed)
Zyban sent to pharmacy with instructions.  Please advise patient to follow-up in 7 to 12 weeks.. I am super proud of you for starting smoking cessation.

## 2020-03-29 ENCOUNTER — Encounter: Payer: Self-pay | Admitting: *Deleted

## 2020-04-09 ENCOUNTER — Other Ambulatory Visit: Payer: Self-pay

## 2020-04-09 DIAGNOSIS — R102 Pelvic and perineal pain: Secondary | ICD-10-CM

## 2020-04-09 MED ORDER — NAPROXEN 500 MG PO TABS: 500.0000 mg | ORAL_TABLET | Freq: Two times a day (BID) | ORAL | 5 refills | Status: AC

## 2020-05-16 LAB — COLOGUARD: Cologuard: NEGATIVE

## 2020-07-04 ENCOUNTER — Ambulatory Visit
Admission: RE | Admit: 2020-07-04 | Discharge: 2020-07-04 | Disposition: A | Discharge status: Home / Self Care | Payer: 59 | Source: Ambulatory Visit | Attending: Nurse Practitioner | Admitting: Nurse Practitioner

## 2020-07-04 ENCOUNTER — Other Ambulatory Visit: Payer: Self-pay

## 2020-09-11 ENCOUNTER — Encounter: Payer: Self-pay | Admitting: Nurse Practitioner

## 2020-09-11 ENCOUNTER — Other Ambulatory Visit: Payer: Self-pay

## 2020-09-11 ENCOUNTER — Ambulatory Visit (INDEPENDENT_AMBULATORY_CARE_PROVIDER_SITE_OTHER): Payer: 59 | Admitting: Nurse Practitioner

## 2020-09-11 ENCOUNTER — Other Ambulatory Visit (HOSPITAL_COMMUNITY)
Admission: RE | Admit: 2020-09-11 | Discharge: 2020-09-11 | Disposition: A | Discharge status: Home / Self Care | Payer: 59 | Source: Ambulatory Visit | Attending: Nurse Practitioner | Admitting: Nurse Practitioner

## 2020-09-11 VITALS — BP 137/82 | HR 82 | Temp 96.9°F | Ht 62.0 in | Wt 124.0 lb

## 2020-09-11 DIAGNOSIS — N939 Abnormal uterine and vaginal bleeding, unspecified: Secondary | ICD-10-CM | POA: Insufficient documentation

## 2020-09-11 DIAGNOSIS — R102 Pelvic and perineal pain: Secondary | ICD-10-CM

## 2020-09-11 LAB — URINALYSIS, ROUTINE W REFLEX MICROSCOPIC
Bilirubin, UA: NEGATIVE
Glucose, UA: NEGATIVE
Ketones, UA: NEGATIVE
Leukocytes,UA: NEGATIVE
Nitrite, UA: NEGATIVE
Protein,UA: NEGATIVE
Specific Gravity, UA: 1.02 (ref 1.005–1.030)
Urobilinogen, Ur: 0.2 mg/dL (ref 0.2–1.0)
pH, UA: 5 (ref 5.0–7.5)

## 2020-09-11 LAB — MICROSCOPIC EXAMINATION
Bacteria, UA: NONE SEEN
RBC, Urine: NONE SEEN /hpf (ref 0–2)
Renal Epithel, UA: NONE SEEN /hpf
WBC, UA: NONE SEEN /hpf (ref 0–5)

## 2020-09-11 NOTE — Patient Instructions (Signed)
Pelvic Pain, Female Pelvic pain is pain in your lower belly (abdomen), below your belly button and between your hips. The pain may start suddenly (be acute), keep coming back (be recurring), or last a long time (become chronic). Pelvic pain that lasts longer than 6 months is called chronic pelvic pain. There are many causes of pelvic pain. Sometimes the cause of pelvic pain is not known. Follow these instructions at home:  Take over-the-counter and prescription medicines only as told by your doctor. Rest as told by your doctor. Do not have sex if it hurts. Keep a journal of your pelvic pain. Write down: When the pain started. Where the pain is located. What seems to make the pain better or worse, such as food or your period (menstrual cycle). Any symptoms you have along with the pain. Keep all follow-up visits as told by your doctor. This is important. Contact a doctor if: Medicine does not help your pain. Your pain comes back. You have new symptoms. You have unusual discharge or bleeding from your vagina. You have a fever or chills. You are having trouble pooping (constipation). You have blood in your pee (urine) or poop (stool). Your pee smells bad. You feel weak or light-headed. Get help right away if: You have sudden pain that is very bad. Your pain keeps getting worse. You have very bad pain and also have any of these symptoms: A fever. Feeling sick to your stomach (nausea). Throwing up (vomiting). Being very sweaty. You pass out (lose consciousness). Summary Pelvic pain is pain in your lower belly (abdomen), below your belly button and between your hips. There are many possible causes of pelvic pain. Keep a journal of your pelvic pain. This information is not intended to replace advice given to you by your health care provider. Make sure you discuss any questions you have with your health care provider. Document Revised: 06/10/2017 Document Reviewed: 06/10/2017 Elsevier  Patient Education  2022 [place]. Postmenopausal Bleeding Postmenopausal bleeding is any bleeding that occurs after menopause. Menopause is a time in a woman's life when monthly periods stop. Any type of bleeding after menopause should be checked by your doctor. Treatment will depend on the cause. This kind of bleeding can be caused by: Taking hormones during menopause. Low or high amounts of female hormones in the body. This can cause the lining of the womb (uterus) to become too thin or too thick. Cancer. Growths in the womb that are not cancer. Follow these instructions at home:  Watch for any changes in your symptoms. Let your doctor know about them. Avoid using tampons and douches as told by your doctor. Change your pads regularly. Get regular pelvic exams. This includes Pap tests. Take iron pills as told by your doctor. Take over-the-counter and prescription medicines only as told by your doctor. Keep all follow-up visits. Contact a doctor if: You have new bleeding from the vagina after menopause. You have pain in your belly (abdomen). Get help right away if: You have a fever or chills. You have very bad pain with bleeding. You have clumps of blood (blood clots) coming from your vagina. You have a lot of bleeding, and: You use more than 1 pad an hour. This kind of bleeding has never happened before. You have headaches. You feel dizzy or you feel like you are going to pass out (faint). Summary Any type of bleeding after menopause should be checked by your doctor. Avoid using tampons or douches. Get regular pelvic exams. This  includes Pap tests. Contact a doctor if you have new bleeding or pain in your belly. Watch for any changes in your symptoms. Let your doctor know about them. This information is not intended to replace advice given to you by your health care provider. Make sure you discuss any questions you have with your health care provider. Document Revised:  06/09/2019 Document Reviewed: 06/09/2019 Elsevier Patient Education  [place].

## 2020-09-11 NOTE — Assessment & Plan Note (Addendum)
Pelvic pain unresolved for the past few weeks.  Urinalysis completed.  Orders for vaginal ultrasound completed.  Education provided to patient printed handouts given.

## 2020-09-11 NOTE — Progress Notes (Addendum)
Acute Office Visit  Subjective:    Patient ID: Denise Mckinney, female    DOB: 1975/10/11, 45 y.o.   MRN: [id number]  Chief Complaint  Patient presents with  . Menstrual Problem    Vaginal Bleeding The patient's primary symptoms include genital lesions and pelvic pain. This is a new problem. The current episode started in the past 7 days. The problem occurs intermittently. The problem has been unchanged. The pain is moderate. The problem affects both sides. Pertinent negatives include no abdominal pain, back pain, fever, flank pain, hematuria, joint swelling, nausea, painful intercourse or vomiting. Nothing aggravates the symptoms. She has tried nothing for the symptoms. She is sexually active. It is unknown whether or not her partner has an STD. She is postmenopausal.    Past Medical History:  Diagnosis Date  . Hearing loss   . Migraines     Past Surgical History:  Procedure Laterality Date  . 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 CHOLECYSTECTOMY    . DILATION AND CURETTAGE OF UTERUS    . pins in right ring and small fingers    . TONSILLECTOMY AND ADENOIDECTOMY    . tubes in ears    . tumor removed under right eye      Family History  Problem Relation Age of Onset  . Heart disease Mother   . Hyperlipidemia Mother   . Hypertension Mother   . Diabetes Mother   . Hypertension Father   . Cancer Father        lung and esophagus  . Diabetes Sister   . Heart disease Sister   . Bipolar disorder Daughter     Social History   Socioeconomic History  . Marital status: Single    Spouse name: Not on file  . Number of children: 3  . Years of education: Not on file  . Highest education level: Some college, no degree  Occupational History  . Not on file  Tobacco Use  . Smoking status: Every Day    Packs/day: 0.50    Years: 36.00    Pack years: 18.00    Types: Cigarettes  . Smokeless tobacco: Never  Vaping Use  . Vaping Use: Never used  Substance and Sexual  Activity  . Alcohol use: Yes    Comment: occ  . Drug use: Never  . Sexual activity: Not Currently  Other Topics Concern  . Not on file  Social History Narrative  . Not on file   Social Determinants of Health   Financial Resource Strain: Not on file  Food Insecurity: Not on file  Transportation Needs: Not on file  Physical Activity: Not on file  Stress: Not on file  Social Connections: Not on file  Intimate Partner Violence: Not on file    Outpatient Medications Prior to Visit  Medication Sig Dispense Refill  . buPROPion (ZYBAN) 150 MG 12 hr tablet Take 1 tablet (150 mg total) by mouth 2 (two) times daily. 150 mg p.o. daily for 3 days, then increase to 150 mg every 12 hours, for 7 to 12 weeks.  Follow-up 150 tablet 2  . naproxen (NAPROSYN) 500 MG tablet Take 1 tablet (500 mg total) by mouth 2 (two) times daily with a meal. 60 tablet 5   No facility-administered medications prior to visit.    No Known Allergies  Review of Systems  Constitutional: Negative.  Negative for fever.  HENT: Negative.    Gastrointestinal:  Negative for abdominal pain, nausea  and vomiting.  Genitourinary:  Positive for pelvic pain and vaginal bleeding. Negative for flank pain and hematuria.  Musculoskeletal:  Negative for back pain.  All other systems reviewed and are negative.     Objective:    Physical Exam Vitals and nursing note reviewed.  Constitutional:      Appearance: Normal appearance.  HENT:     Head: Normocephalic.     Nose: Nose normal.     Mouth/Throat:     Mouth: Mucous membranes are moist.     Pharynx: Oropharynx is clear.  Eyes:     Conjunctiva/sclera: Conjunctivae normal.  Cardiovascular:     Rate and Rhythm: Normal rate and regular rhythm.     Pulses: Normal pulses.     Heart sounds: Normal heart sounds.  Pulmonary:     Effort: Pulmonary effort is normal.     Breath sounds: Normal breath sounds.  Abdominal:     General: Bowel sounds are normal.     Tenderness:  There is no right CVA tenderness or left CVA tenderness.  Genitourinary:      Comments: Lesion on outer part of both lbia Musculoskeletal:       Legs:     Comments: Pelvic tenderness   Skin:    Findings: No rash.  Neurological:     Mental Status: She is alert and oriented to person, place, and time.  Psychiatric:        Behavior: Behavior normal.    BP 137/82   Pulse 82   Temp (!) 96.9 F (36.1 C) (Temporal)   Ht 5' 2"  (1.575 m)   Wt 124 lb (56.2 kg)   SpO2 98%   BMI 22.68 kg/m  Wt Readings from Last 3 Encounters:  09/11/20 124 lb (56.2 kg)  03/09/20 120 lb 6.4 oz (54.6 kg)  02/27/20 119 lb 6.4 oz (54.2 kg)    Health Maintenance Due  Topic Date Due  . Pneumococcal Vaccine 56-8 Years old (1 - PCV) Never done  . COLONOSCOPY (Pts 45-77yr Insurance coverage will need to be confirmed)  Never done  . INFLUENZA VACCINE  Never done    There are no preventive care reminders to display for this patient.   No results found for: TSH Lab Results  Component Value Date   WBC 7.2 03/09/2020   HGB 16.1 (H) 03/09/2020   HCT 48.1 (H) 03/09/2020   MCV 94 03/09/2020   PLT 177 03/09/2020   Lab Results  Component Value Date   NA 140 03/09/2020   K 3.9 03/09/2020   CO2 25 03/09/2020   GLUCOSE 76 03/09/2020   BUN 12 03/09/2020   CREATININE 0.81 03/09/2020   BILITOT 0.4 03/09/2020   ALKPHOS 105 03/09/2020   AST 20 03/09/2020   ALT 12 03/09/2020   PROT 6.6 03/09/2020   ALBUMIN 4.4 03/09/2020   CALCIUM 9.3 03/09/2020   EGFR 91 03/09/2020   Lab Results  Component Value Date   CHOL 187 03/09/2020   Lab Results  Component Value Date   HDL 51 03/09/2020   Lab Results  Component Value Date   LDLCALC 121 (H) 03/09/2020   Lab Results  Component Value Date   TRIG 82 03/09/2020   Lab Results  Component Value Date   CHOLHDL 3.7 03/09/2020   No results found for: HGBA1C     Assessment & Plan:   Problem List Items Addressed This Visit       Other   Pelvic  pain    Pelvic  pain unresolved for the past few weeks.  Urinalysis completed.  Orders for vaginal ultrasound completed.  Education provided to patient printed handouts given.      Relevant Orders   Urinalysis, Routine w reflex microscopic (Completed)   Microscopic Examination (Completed)   Vaginal bleeding - Primary    New vaginal bleeding in the past 2 weeks.  Patient is postmenopausal and has not had a menstrual period in 5 years.  Patient started menopause in her 52s.  Patient describes discharge as dark brown, milky sometimes with breast tenderness.  She reports being sexually active about 1-2 times a year and has been sexually active in the last 2 months.  No other signs and symptoms associated. Completed vaginal exam, Pap completed, referral to OB/GYN completed.  Urinalysis completed results pending.  Follow-up with worsening unresolved symptoms.      Relevant Orders   Cytology - PAP([facility])   US OB Transvaginal     No orders of the defined types were placed in this encounter.    Ivy Lynn, NP

## 2020-09-11 NOTE — Assessment & Plan Note (Signed)
New vaginal bleeding in the past 2 weeks.  Patient is postmenopausal and has not had a menstrual period in 5 years.  Patient started menopause in her 40s.  Patient describes discharge as dark brown, milky sometimes with breast tenderness.  She reports being sexually active about 1-2 times a year and has been sexually active in the last 2 months.  No other signs and symptoms associated. Completed vaginal exam, Pap completed, referral to OB/GYN completed.  Urinalysis completed results pending.  Follow-up with worsening unresolved symptoms.

## 2020-09-13 LAB — CYTOLOGY - PAP: Diagnosis: NEGATIVE

## 2020-09-20 ENCOUNTER — Other Ambulatory Visit: Payer: Self-pay | Admitting: Nurse Practitioner

## 2020-09-20 DIAGNOSIS — N950 Postmenopausal bleeding: Secondary | ICD-10-CM | POA: Insufficient documentation

## 2020-09-28 ENCOUNTER — Other Ambulatory Visit: Payer: Self-pay | Admitting: Nurse Practitioner

## 2020-09-28 DIAGNOSIS — N939 Abnormal uterine and vaginal bleeding, unspecified: Secondary | ICD-10-CM

## 2020-10-08 ENCOUNTER — Ambulatory Visit (HOSPITAL_COMMUNITY)
Admission: RE | Admit: 2020-10-08 | Discharge: 2020-10-08 | Disposition: A | Discharge status: Home / Self Care | Payer: 59 | Source: Ambulatory Visit | Attending: Nurse Practitioner | Admitting: Nurse Practitioner

## 2020-10-08 ENCOUNTER — Other Ambulatory Visit: Payer: Self-pay

## 2020-10-08 DIAGNOSIS — N939 Abnormal uterine and vaginal bleeding, unspecified: Secondary | ICD-10-CM | POA: Insufficient documentation

## 2020-10-09 NOTE — Progress Notes (Signed)
Returning nurse call

## 2020-10-17 ENCOUNTER — Ambulatory Visit (INDEPENDENT_AMBULATORY_CARE_PROVIDER_SITE_OTHER): Payer: 59 | Admitting: Nurse Practitioner

## 2020-10-17 DIAGNOSIS — U071 COVID-19: Secondary | ICD-10-CM | POA: Diagnosis not present

## 2020-10-17 MED ORDER — NIRMATRELVIR/RITONAVIR (PAXLOVID)TABLET: 3.0000 | ORAL_TABLET | Freq: Two times a day (BID) | ORAL | 0 refills | Status: AC

## 2020-10-17 NOTE — Progress Notes (Signed)
   Virtual Visit  Note Due to COVID-19 pandemic this visit was conducted virtually. This visit type was conducted due to national recommendations for restrictions regarding the COVID-19 Pandemic (e.g. social distancing, sheltering in place) in an effort to limit this patient's exposure and mitigate transmission in our community. All issues noted in this document were discussed and addressed.  A physical exam was not performed with this format.  I connected with Denise Mckinney on 10/17/20 at 9: 00 am by telephone and verified that I am speaking with the correct person using two identifiers. Denise Mckinney is currently located at home during visit. The provider, Ivy Lynn, NP is located in their office at time of visit.  I discussed the limitations, risks, security and privacy concerns of performing an evaluation and management service by telephone and the availability of in person appointments. I also discussed with the patient that there may be a patient responsible charge related to this service. The patient expressed understanding and agreed to proceed.   History and Present Illness:  Cough This is a new problem. The current episode started yesterday (3 days). The problem has been gradually worsening. The problem occurs constantly. The cough is Non-productive. Associated symptoms include chills, headaches and nasal congestion. Pertinent negatives include no sore throat or sweats. Nothing aggravates the symptoms.     Review of Systems  Constitutional:  Positive for chills.  HENT: Negative.  Negative for sore throat.   Respiratory:  Positive for cough.   Neurological:  Positive for headaches.  All other systems reviewed and are negative.   Observations/Objective: Tele-visit patient not in distress  Assessment and Plan: Take meds as prescribed - Use a cool mist humidifier  -Use saline nose sprays frequently -Force fluids -For fever or aches or pains- take Tylenol for fever or  headache -Paxlovid antiviral RX sent to pharmacy, education provided to patient. Hold Buspar until completion of antiviral. Patient verbalize understanding. - at home COVID test positive, patient needs test (PCR) for work   Follow Up Instructions: Follow up with worsening unresolved symptoms    I discussed the assessment and treatment plan with the patient. The patient was provided an opportunity to ask questions and all were answered. The patient agreed with the plan and demonstrated an understanding of the instructions.   The patient was advised to call back or seek an in-person evaluation if the symptoms worsen or if the condition fails to improve as anticipated.  The above assessment and management plan was discussed with the patient. The patient verbalized understanding of and has agreed to the management plan. Patient is aware to call the clinic if symptoms persist or worsen. Patient is aware when to return to the clinic for a follow-up visit. Patient educated on when it is appropriate to go to the emergency department.   Time call ended:  9:10 am   I provided 10 minutes of  non face-to-face time during this encounter.    Ivy Lynn, NP

## 2020-10-17 NOTE — Assessment & Plan Note (Addendum)
Take meds as prescribed - Use a cool mist humidifier  -Use saline nose sprays frequently -Force fluids  -For fever or aches or pains- take Tylenol for fever or headache -Paxlovid antiviral RX sent to pharmacy, education provided to patient. Hold Buspar until completion of antiviral. Patient verbalize understanding. - at home COVID test positive, patient needs test (PCR) for work Follow up with worsening unresolved symptoms

## 2020-10-18 LAB — NOVEL CORONAVIRUS, NAA: SARS-CoV-2, NAA: DETECTED — AB

## 2020-10-18 LAB — SARS-COV-2, NAA 2 DAY TAT

## 2020-10-19 ENCOUNTER — Encounter: Payer: Self-pay | Admitting: Nurse Practitioner

## 2020-11-05 ENCOUNTER — Other Ambulatory Visit: Payer: Self-pay | Admitting: Nurse Practitioner

## 2020-11-05 DIAGNOSIS — Z720 Tobacco use: Secondary | ICD-10-CM

## 2021-01-22 ENCOUNTER — Encounter: Payer: Self-pay | Admitting: *Deleted

## 2021-01-25 DIAGNOSIS — Z029 Encounter for administrative examinations, unspecified: Secondary | ICD-10-CM

## 2021-02-06 ENCOUNTER — Encounter: Payer: Self-pay | Admitting: Nurse Practitioner

## 2021-02-06 ENCOUNTER — Ambulatory Visit (INDEPENDENT_AMBULATORY_CARE_PROVIDER_SITE_OTHER): Payer: 59 | Admitting: Nurse Practitioner

## 2021-02-06 VITALS — BP 126/79 | HR 70 | Temp 98.3°F | Ht 62.0 in | Wt 118.0 lb

## 2021-02-06 DIAGNOSIS — M25511 Pain in right shoulder: Secondary | ICD-10-CM | POA: Diagnosis not present

## 2021-02-06 MED ORDER — METHYLPREDNISOLONE ACETATE 80 MG/ML IJ SUSP
80.0000 mg | Freq: Once | INTRAMUSCULAR | Status: AC
Start: 2021-02-06 — End: 2021-02-06
  Administered 2021-02-06: 80 mg via INTRAMUSCULAR

## 2021-02-06 MED ORDER — PREDNISONE 10 MG (21) PO TBPK: ORAL_TABLET | ORAL | 0 refills | Status: DC

## 2021-02-06 MED ORDER — IBUPROFEN 600 MG PO TABS: 600.0000 mg | ORAL_TABLET | Freq: Three times a day (TID) | ORAL | 0 refills | Status: AC | PRN

## 2021-02-06 NOTE — Assessment & Plan Note (Signed)
Uncontrolled right shoulder pain. Encouraged to rest shoulder, take medication as prescribed, completed physical therapy referral, completed right shoulder X-ray results pending.   Follow up with unresolved symptoms

## 2021-02-06 NOTE — Progress Notes (Signed)
Acute Office Visit  Subjective:    Patient ID: Denise Mckinney, female    DOB: [birth year], 46 y.o.   MRN: [id number]  Chief Complaint  Patient presents with   Shoulder Pain    Shoulder pain/stiff neck Right side Radiates down arm  Occasional numbness and tingling    Shoulder Pain  The pain is present in the neck and right shoulder. This is a recurrent problem. The current episode started more than 1 year ago. The problem has been unchanged. The quality of the pain is described as aching. The pain is at a severity of 8/10. The pain is severe. Associated symptoms include numbness and tingling.     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     CHOLECYSTECTOMY     DILATION AND CURETTAGE OF UTERUS     pins in right ring and small fingers     TONSILLECTOMY AND ADENOIDECTOMY     tubes in ears     tumor removed under right eye      Family History  Problem Relation Age of Onset   Heart disease Mother    Hyperlipidemia Mother    Hypertension Mother    Diabetes Mother    Hypertension Father    Cancer Father        lung and esophagus   Diabetes Sister    Heart disease Sister    Bipolar disorder Daughter     Social History   Socioeconomic History   Marital status: Single    Spouse name: Not on file   Number of children: 3   Years of education: Not on file   Highest education level: Some college, no degree  Occupational History   Not on file  Tobacco Use   Smoking status: Every Day    Packs/day: 0.50    Years: 36.00    Pack years: 18.00    Types: Cigarettes   Smokeless tobacco: Never  Vaping Use   Vaping Use: Never used  Substance and Sexual Activity   Alcohol use: Yes    Comment: occ   Drug use: Never   Sexual activity: Not  Currently  Other Topics Concern   Not on file  Social History Narrative   Not on file   Social Determinants of Health   Financial Resource Strain: Not on file  Food Insecurity: Not on file  Transportation Needs: Not on file  Physical Activity: Not on file  Stress: Not on file  Social Connections: Not on file  Intimate Partner Violence: Not on file    Outpatient Medications Prior to Visit  Medication Sig Dispense Refill   naproxen (NAPROSYN) 500 MG tablet Take 1 tablet (500 mg total) by mouth 2 (two) times daily with a meal. 60 tablet 5   No facility-administered medications prior to visit.    No Known Allergies  Review of Systems  Constitutional: Negative.   HENT: Negative.    Eyes: Negative.   Respiratory: Negative.    Cardiovascular: Negative.   Genitourinary: Negative.   Musculoskeletal:  Positive for arthralgias.  Skin: Negative.   Neurological:  Positive for tingling and numbness.  All other systems reviewed and are negative.     Objective:    Physical Exam Vitals and nursing note reviewed.  Constitutional:  Appearance: Normal appearance.  HENT:     Right Ear: External ear normal.     Left Ear: External ear normal.     Mouth/Throat:     Mouth: Mucous membranes are moist.     Pharynx: Oropharynx is clear.  Eyes:     Conjunctiva/sclera: Conjunctivae normal.  Cardiovascular:     Rate and Rhythm: Normal rate and regular rhythm.  Pulmonary:     Effort: Pulmonary effort is normal.     Breath sounds: Normal breath sounds.  Abdominal:     General: Bowel sounds are normal.  Musculoskeletal:     Right shoulder: Tenderness present. Decreased range of motion.  Skin:    General: Skin is warm.     Findings: No rash.  Neurological:     Mental Status: She is alert and oriented to person, place, and time.    BP 126/79    Pulse 70    Temp 98.3 F (36.8 C)    Ht 5' 2"  (1.575 m)    Wt 118 lb (53.5 kg)    SpO2 96%    BMI 21.58 kg/m  Wt Readings from Last 3  Encounters:  02/06/21 118 lb (53.5 kg)  09/11/20 124 lb (56.2 kg)  03/09/20 120 lb 6.4 oz (54.6 kg)    Health Maintenance Due  Topic Date Due   COVID-19 Vaccine (1) Never done   COLONOSCOPY (Pts 45-63yr Insurance coverage will need to be confirmed)  Never done   INFLUENZA VACCINE  Never done    There are no preventive care reminders to display for this patient.   No results found for: TSH Lab Results  Component Value Date   WBC 7.2 03/09/2020   HGB 16.1 (H) 03/09/2020   HCT 48.1 (H) 03/09/2020   MCV 94 03/09/2020   PLT 177 03/09/2020   Lab Results  Component Value Date   NA 140 03/09/2020   K 3.9 03/09/2020   CO2 25 03/09/2020   GLUCOSE 76 03/09/2020   BUN 12 03/09/2020   CREATININE 0.81 03/09/2020   BILITOT 0.4 03/09/2020   ALKPHOS 105 03/09/2020   AST 20 03/09/2020   ALT 12 03/09/2020   PROT 6.6 03/09/2020   ALBUMIN 4.4 03/09/2020   CALCIUM 9.3 03/09/2020   EGFR 91 03/09/2020   Lab Results  Component Value Date   CHOL 187 03/09/2020   Lab Results  Component Value Date   HDL 51 03/09/2020   Lab Results  Component Value Date   LDLCALC 121 (H) 03/09/2020   Lab Results  Component Value Date   TRIG 82 03/09/2020   Lab Results  Component Value Date   CHOLHDL 3.7 03/09/2020   No results found for: HGBA1C     Assessment & Plan:   Problem List Items Addressed This Visit       Other   Acute pain of right shoulder - Primary    Uncontrolled right shoulder pain. Encouraged to rest shoulder, take medication as prescribed, completed physical therapy referral, completed right shoulder X-ray results pending.   Follow up with unresolved symptoms      Relevant Medications   ibuprofen (ADVIL) 600 MG tablet   predniSONE (STERAPRED UNI-PAK 21 TAB) 10 MG (21) TBPK tablet   Other Relevant Orders   Ambulatory referral to Physical Therapy   DG Shoulder Right     Meds ordered this encounter  Medications   ibuprofen (ADVIL) 600 MG tablet    Sig: Take 1  tablet (600 mg total) by mouth  every 8 (eight) hours as needed.    Dispense:  30 tablet    Refill:  0    Order Specific Question:   Supervising Provider    Answer:   Claretta Fraise [[id number]]   predniSONE (STERAPRED UNI-PAK 21 TAB) 10 MG (21) TBPK tablet    Sig: 6 tablet day1, 5 tablet day 2, 4 tablet day 3, 3 tablet day 4, 2 tablet day 5, 1 tablet day 6    Dispense:  1 each    Refill:  0    Order Specific Question:   Supervising Provider    AnswerJeneen Rinks     Ivy Lynn, NP

## 2021-02-06 NOTE — Patient Instructions (Addendum)
Shoulder Pain °Many things can cause shoulder pain, including: °An injury to the shoulder. °Overuse of the shoulder. °Arthritis. °The source of the pain can be: °Inflammation. °An injury to the shoulder joint. °An injury to a tendon, ligament, or bone. °Follow these instructions at home: °Pay attention to changes in your symptoms. Let your health care provider know about them. Follow these instructions to relieve your pain. °If you have a sling: °Wear the sling as told by your health care provider. Remove it only as told by your health care provider. °Loosen the sling if your fingers tingle, become numb, or turn cold and blue. °Keep the sling clean. °If the sling is not waterproof: °Do not let it get wet. Remove it to shower or bathe. °Move your arm as little as possible, but keep your hand moving to prevent swelling. °Managing pain, stiffness, and swelling ° °If directed, put ice on the painful area: °Put ice in a plastic bag. °Place a towel between your skin and the bag. °Leave the ice on for 20 minutes, 2-3 times per day. Stop applying ice if it does not help with the pain. °Squeeze a soft ball or a foam pad as much as possible. This helps to keep the shoulder from swelling. It also helps to strengthen the arm. °General instructions °Take over-the-counter and prescription medicines only as told by your health care provider. °Keep all follow-up visits as told by your health care provider. This is important. °Contact a health care provider if: °Your pain gets worse. °Your pain is not relieved with medicines. °New pain develops in your arm, hand, or fingers. °Get help right away if: °Your arm, hand, or fingers: °Tingle. °Become numb. °Become swollen. °Become painful. °Turn white or blue. °Summary °Shoulder pain can be caused by an injury, overuse, or arthritis. °Pay attention to changes in your symptoms. Let your health care provider know about them. °This condition may be treated with a sling, ice, and pain  medicines. °Contact your health care provider if the pain gets worse or new pain develops. Get help right away if your arm, hand, or fingers tingle or become numb, swollen, or painful. °Keep all follow-up visits as told by your health care provider. This is important. °This information is not intended to replace advice given to you by your health care provider. Make sure you discuss any questions you have with your health care provider. °Document Revised: 07/07/2017 Document Reviewed: 07/07/2017 °Elsevier Patient Education © 2022 [employer] ° °

## 2021-02-06 NOTE — Addendum Note (Signed)
Addended by: Geryl Rankins D on: 02/06/2021 11:09 AM   Modules accepted: Orders

## 2021-02-07 ENCOUNTER — Ambulatory Visit (INDEPENDENT_AMBULATORY_CARE_PROVIDER_SITE_OTHER): Payer: 59

## 2021-02-07 ENCOUNTER — Other Ambulatory Visit: Payer: 59

## 2021-02-07 DIAGNOSIS — M25511 Pain in right shoulder: Secondary | ICD-10-CM

## 2021-02-11 ENCOUNTER — Telehealth: Payer: Self-pay | Admitting: Nurse Practitioner

## 2021-02-11 NOTE — Telephone Encounter (Signed)
Patient calling back to check on x ray results. Please call back.

## 2021-02-11 NOTE — Telephone Encounter (Signed)
Patient aware of results.

## 2021-02-13 ENCOUNTER — Ambulatory Visit: Payer: 59 | Attending: Nurse Practitioner

## 2021-02-13 ENCOUNTER — Other Ambulatory Visit: Payer: Self-pay

## 2021-02-13 DIAGNOSIS — M25511 Pain in right shoulder: Secondary | ICD-10-CM | POA: Diagnosis not present

## 2021-02-13 DIAGNOSIS — M6281 Muscle weakness (generalized): Secondary | ICD-10-CM | POA: Insufficient documentation

## 2021-02-13 NOTE — Therapy (Addendum)
[facility] [place], [place], [address] Phone: [phone]   Fax:  [phone]  Physical Therapy Evaluation  Patient Details  Name: Denise Mckinney MRN: [id number] Date of Birth: 1975/01/07 Referring Provider (PT): Christiana Pellant, NP   Encounter Date: 02/13/2021   PT End of Session - 02/13/21 0901     Visit Number 1    Number of Visits 12    Date for PT Re-Evaluation 05/03/21    PT Start Time 0903    PT Stop Time 1013    PT Time Calculation (min) 70 min    Activity Tolerance Patient tolerated treatment well    Behavior During Therapy [facility] for tasks assessed/performed             Past Medical History:  Diagnosis Date   Hearing loss    Migraines     Past Surgical History:  Procedure Laterality Date   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    Subjective Assessment - 02/13/21 0901     Subjective Patient reports that her right shoulder has "been hurting for a while." She notes that she has to lift and move packages at work and that is when she first noticed it. She has noticed that it has gotten worse since it first started as it would "come and go, but has now forgotten to leave." She notes that going to the chiropractor made her pain worse and she has not gone back in about five weeks. She notes no history of any right shoulder problems. She notes that her pain will get so severe that it will cause her to begin vomiting.    Pertinent History history of fracture to her 4th and 5th digits of her right hand    Limitations Lifting;House hold activities    Patient Stated Goals reduced pain    Currently in Pain? Yes    Pain Score 10-Worst pain ever    Pain Location Shoulder    Pain  Orientation Right;Posterior    Pain Descriptors / Indicators Burning;Other (Comment)   pinch   Pain Type Chronic pain    Pain Radiating Towards radiating from her posterior right shoulder down to her hand    Pain Onset More than a month ago    Pain Frequency Constant    Aggravating Factors  using her right arm    Pain Relieving Factors biofreeze (short term relief)    Effect of Pain on Daily Activities she has to modify her activities, limits her ability to sleep (1-2 hours prior to being awakened by her pain)                [facility] PT Assessment - 02/13/21 0001       Assessment   Medical Diagnosis Acute right shoulder pain    Referring Provider (PT) Christiana Pellant, NP    Onset Date/Surgical Date --   "a while"   Hand Dominance Right    Next MD Visit 03/12/21    Prior Therapy No      Precautions   Precautions None      Restrictions   Weight Bearing Restrictions No  Other Position/Activity Restrictions She was recommended to keep her arm in a sling 24/7, but she says this is "impossible"      Balance Screen   Has the patient fallen in the past 6 months No    Has the patient had a decrease in activity level because of a fear of falling?  No    Is the patient reluctant to leave their home because of a fear of falling?  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    Leisure Play with her grandchildren      Cognition   Overall Cognitive Status Within Functional Limits for tasks assessed    Attention Focused    Focused Attention Appears intact    Memory Appears intact    Awareness Appears intact    Problem Solving Appears intact      Sensation   Additional Comments Patient reports tingling along her right scapula and can radiate down her right hand      ROM / Strength   AROM / PROM / Strength Strength;AROM       AROM   AROM Assessment Site Shoulder    Right/Left Shoulder Right;Left    Right Shoulder Flexion 154 Degrees    Right Shoulder ABduction 140 Degrees   painful pop   Right Shoulder Internal Rotation --   to T8   Right Shoulder External Rotation --   to T5   Left Shoulder Flexion 160 Degrees    Left Shoulder ABduction 175 Degrees    Left Shoulder Internal Rotation --   to T4   Left Shoulder External Rotation --   to T5     Strength   Strength Assessment Site Shoulder;Hand;Elbow    Right/Left Shoulder Right;Left    Right Shoulder Flexion 4-/5   "pull" along posterior neck and shoulder   Right Shoulder ABduction 4/5    Right Shoulder Internal Rotation 4+/5    Right Shoulder External Rotation 4/5   "like the muscle was twisting"   Left Shoulder Flexion 4-/5    Left Shoulder ABduction 4/5    Left Shoulder Internal Rotation 5/5    Left Shoulder External Rotation 4/5    Right/Left Elbow Right;Left    Right Elbow Flexion 4+/5    Right Elbow Extension 4/5   reproduced the familiar tingling symptoms   Left Elbow Flexion 4+/5    Left Elbow Extension 4+/5    Right/Left hand Left;Right    Right Hand Grip (lbs) 40    Left Hand Grip (lbs) 38      Palpation   Palpation comment TTP: right triceps, infraspinatus, periscapular musculature (reproduced familiar right arm pain),   palpable tightness of supraspinatus and upper trapezius                       Objective measurements completed on examination: See above findings.       [facility] Adult PT Treatment/Exercise - 02/13/21 0001       Modalities   Modalities Electrical Stimulation      Electrical Stimulation   Electrical Stimulation Location right periscapular musculature    Electrical Stimulation Action pre-mod    Electrical Stimulation Parameters 80-150 Hz x 20 minutes    Electrical Stimulation Goals Pain  Manual Therapy   Manual Therapy Soft tissue mobilization;Scapular mobilization    Soft tissue  mobilization to right periscapular musculature, infraspinatus, and triceps    Scapular Mobilization grade I-II all planes                          PT Long Term Goals - 02/13/21 1010       PT LONG TERM GOAL #1   Title Patient will be independent with her HEP.    Time 6    Period Weeks    Status New    Target Date 03/27/21      PT LONG TERM GOAL #2   Title Patient will report that her familiar right shoulder pain does not exceed 7/10 with ADL's.    Time 6    Period Weeks    Status New    Target Date 03/27/21      PT LONG TERM GOAL #3   Title Patient will be able to lift and carry at least 10 pounds with her right upper extremity for improved function caring for her grandson.    Time 6    Period Weeks    Status New    Target Date 03/27/21                    Plan - 02/13/21 0959     Clinical Impression Statement Patient is a 46 year old female presenting to physical therapy with right shoulder pain from repetitive use. She presented today with high pain severity and irritability. Her familiar pain and radiating symptoms were able to be reproduced with palpation to her periscapular musculature. This was able to be slightly reduced with electrical stimulation to this are at the conclusion of treatment today. She reported that her shoulder felt better upon the conclusion of treatment. She continues to require skilled physical therapy to address her remaining impairments to return to her prior level of function.    Personal Factors and Comorbidities Profession;Time since onset of injury/illness/exacerbation    Examination-Activity Limitations Reach Overhead;Caring for Others;Carry;Lift    Examination-Participation Restrictions Occupation;Other    Stability/Clinical Decision Making Evolving/Moderate complexity    Clinical Decision Making Moderate    Rehab Potential Good    PT Frequency 2x / week    PT Duration 6 weeks    PT Treatment/Interventions ADLs/Self  Care Home Management;Cryotherapy;Electrical Stimulation;Iontophoresis 77m/ml Dexamethasone;Moist Heat;Ultrasound;Neuromuscular re-education;Therapeutic exercise;Therapeutic activities;Functional mobility training;Patient/family education;Manual techniques;Dry needling;Passive range of motion;Taping;Vasopneumatic Device    PT Next Visit Plan nustep progressing to the UBE, scapular retraction, STM to posterior shoulder, and modalities as needed    Consulted and Agree with Plan of Care Patient             Patient will benefit from skilled therapeutic intervention in order to improve the following deficits and impairments:  Decreased range of motion, Impaired UE functional use, Decreased activity tolerance, Pain, Decreased strength, Impaired sensation  Visit Diagnosis: Right shoulder pain, unspecified chronicity  Muscle weakness (generalized)     Problem List Patient Active Problem List   Diagnosis Date Noted   Acute pain of right shoulder 02/06/2021   Positive self-administered antigen test for COVID-19 10/17/2020   Post-menopausal bleeding 09/20/2020   Vaginal bleeding 09/11/2020   Tobacco abuse 03/19/2020   Annual physical exam 03/09/2020   Pelvic pain 03/09/2020   History of hearing loss 02/27/2020   Migraine without aura and without status migrainosus, not intractable 02/27/2020    JLamar Benes  [place], PT 02/13/2021, 1:17 PM  [facility] [place], [place], [address] Phone: [phone]   Fax:  [phone]  Name: Denise Mckinney MRN: [id number] Date of Birth: 02-13-1975  PHYSICAL THERAPY DISCHARGE SUMMARY  Visits from Start of Care: 1  Current functional level related to goals / functional outcomes: Patient is being discharged at this time as she has not returned since her initial evaluation   Remaining deficits: See evaluation    Education / Equipment: HEP   Patient agrees to discharge. Patient goals were not met.  Patient is being discharged due to not returning since the last visit.

## 2021-02-20 ENCOUNTER — Ambulatory Visit: Payer: 59

## 2021-03-11 ENCOUNTER — Encounter: Payer: 59 | Admitting: Nurse Practitioner

## 2021-03-12 ENCOUNTER — Encounter: Payer: 59 | Admitting: Nurse Practitioner

## 2021-04-17 ENCOUNTER — Encounter: Payer: Self-pay | Admitting: Nurse Practitioner

## 2021-04-17 ENCOUNTER — Ambulatory Visit (INDEPENDENT_AMBULATORY_CARE_PROVIDER_SITE_OTHER): Payer: 59 | Admitting: Nurse Practitioner

## 2021-04-17 VITALS — BP 121/72 | HR 65 | Temp 98.2°F | Resp 18 | Ht 62.0 in | Wt 117.6 lb

## 2021-04-17 DIAGNOSIS — Z720 Tobacco use: Secondary | ICD-10-CM | POA: Diagnosis not present

## 2021-04-17 DIAGNOSIS — Z0001 Encounter for general adult medical examination with abnormal findings: Secondary | ICD-10-CM | POA: Diagnosis not present

## 2021-04-17 DIAGNOSIS — Z0000 Encounter for general adult medical examination without abnormal findings: Secondary | ICD-10-CM

## 2021-04-17 LAB — CBC WITH DIFFERENTIAL/PLATELET
Basophils Absolute: 0 10*3/uL (ref 0.0–0.2)
Basos: 0 %
EOS (ABSOLUTE): 0.2 10*3/uL (ref 0.0–0.4)
Eos: 2 %
Hematocrit: 48.3 % — ABNORMAL HIGH (ref 34.0–46.6)
Hemoglobin: 16 g/dL — ABNORMAL HIGH (ref 11.1–15.9)
Immature Grans (Abs): 0 10*3/uL (ref 0.0–0.1)
Immature Granulocytes: 0 %
Lymphocytes Absolute: 2.9 10*3/uL (ref 0.7–3.1)
Lymphs: 32 %
MCH: 30.7 pg (ref 26.6–33.0)
MCHC: 33.1 g/dL (ref 31.5–35.7)
MCV: 93 fL (ref 79–97)
Monocytes Absolute: 0.5 10*3/uL (ref 0.1–0.9)
Monocytes: 5 %
Neutrophils Absolute: 5.6 10*3/uL (ref 1.4–7.0)
Neutrophils: 61 %
Platelets: 180 10*3/uL (ref 150–450)
RBC: 5.22 x10E6/uL (ref 3.77–5.28)
RDW: 12.4 % (ref 11.7–15.4)
WBC: 9.2 10*3/uL (ref 3.4–10.8)

## 2021-04-17 LAB — COMPREHENSIVE METABOLIC PANEL
ALT: 11 IU/L (ref 0–32)
AST: 19 IU/L (ref 0–40)
Albumin/Globulin Ratio: 2.4 — ABNORMAL HIGH (ref 1.2–2.2)
Albumin: 4.5 g/dL (ref 3.8–4.8)
Alkaline Phosphatase: 96 IU/L (ref 44–121)
BUN/Creatinine Ratio: 26 — ABNORMAL HIGH (ref 9–23)
BUN: 18 mg/dL (ref 6–24)
Bilirubin Total: 0.7 mg/dL (ref 0.0–1.2)
CO2: 25 mmol/L (ref 20–29)
Calcium: 9.6 mg/dL (ref 8.7–10.2)
Chloride: 107 mmol/L — ABNORMAL HIGH (ref 96–106)
Creatinine, Ser: 0.68 mg/dL (ref 0.57–1.00)
Globulin, Total: 1.9 g/dL (ref 1.5–4.5)
Glucose: 81 mg/dL (ref 70–99)
Potassium: 4.1 mmol/L (ref 3.5–5.2)
Sodium: 143 mmol/L (ref 134–144)
Total Protein: 6.4 g/dL (ref 6.0–8.5)
eGFR: 108 mL/min/{1.73_m2} (ref >59)

## 2021-04-17 LAB — LIPID PANEL
Chol/HDL Ratio: 3.4 ratio (ref 0.0–4.4)
Cholesterol, Total: 180 mg/dL (ref 100–199)
HDL: 53 mg/dL (ref >39)
LDL Chol Calc (NIH): 114 mg/dL — ABNORMAL HIGH (ref 0–99)
Triglycerides: 69 mg/dL (ref 0–149)
VLDL Cholesterol Cal: 13 mg/dL (ref 5–40)

## 2021-04-17 MED ORDER — NICOTINE 14 MG/24HR TD PT24: 14.0000 mg | MEDICATED_PATCH | Freq: Every day | TRANSDERMAL | 0 refills | Status: AC

## 2021-04-17 MED ORDER — NICOTINE 21 MG/24HR TD PT24
21.0000 mg | MEDICATED_PATCH | Freq: Every day | TRANSDERMAL | 1 refills | Status: AC
Start: 2021-04-17 — End: ?

## 2021-04-17 MED ORDER — NICOTINE 7 MG/24HR TD PT24: 7.0000 mg | MEDICATED_PATCH | Freq: Every day | TRANSDERMAL | 0 refills | Status: AC

## 2021-04-17 NOTE — Assessment & Plan Note (Addendum)
Education completed to patient on smoking cessation.  Patient is willing to start smoking cessation.  Patient will be started on nicotine patch and follow-up in 12 weeks  ?

## 2021-04-17 NOTE — Assessment & Plan Note (Signed)
Completed annual physical exam no new complaints.  Provided education to patient.  Labs completed today CBC CMP lipid panel follow-up in 1 year ?

## 2021-04-17 NOTE — Patient Instructions (Signed)
Managing the Challenge of Quitting Smoking ?Quitting smoking is a physical and mental challenge. You will face cravings, withdrawal symptoms, and temptation. Before quitting, work with your health care provider to make a plan that can help you manage quitting. Preparation can help you quit and keep you from giving in. ?How to manage lifestyle changes ?Managing stress ?Stress can make you want to smoke, and wanting to smoke may cause stress. It is important to find ways to manage your stress. You might try some of the following: ?Practice relaxation techniques. ?Breathe slowly and deeply, in through your nose and out through your mouth. ?Listen to music. ?Soak in a bath or take a shower. ?Imagine a peaceful place or vacation. ?Get some support. ?Talk with family or friends about your stress. ?Join a support group. ?Talk with a counselor or therapist. ?Get some physical activity. ?Go for a walk, run, or bike ride. ?Play a favorite sport. ?Practice yoga. ? ?Medicines ?Talk with your health care provider about medicines that might help you deal with cravings and make quitting easier for you. ?Relationships ?Social situations can be difficult when you are quitting smoking. To manage this, you can: ?Avoid parties and other social situations where people might be smoking. ?Avoid alcohol. ?Leave right away if you have the urge to smoke. ?Explain to your family and friends that you are quitting smoking. Ask for support and let them know you might be a bit grumpy. ?Plan activities where smoking is not an option. ?General instructions ?Be aware that many people gain weight after they quit smoking. However, not everyone does. To keep from gaining weight, have a plan in place before you quit and stick to the plan after you quit. Your plan should include: ?Having healthy snacks. When you have a craving, it may help to: ?Eat popcorn, carrots, celery, or other cut vegetables. ?Chew sugar-free gum. ?Changing how you eat. ?Eat small  portion sizes at meals. ?Eat 4-6 small meals throughout the day instead of 1-2 large meals a day. ?Be mindful when you eat. Do not watch television or do other things that might distract you as you eat. ?Exercising regularly. ?Make time to exercise each day. If you do not have time for a long workout, do short bouts of exercise for 5-10 minutes several times a day. ?Do some form of strengthening exercise, such as weight lifting. ?Do some exercise that gets your heart beating and causes you to breathe deeply, such as walking fast, running, swimming, or biking. This is very important. ?Drinking plenty of water or other low-calorie or no-calorie drinks. Drink 6-8 glasses of water daily. ? ?How to recognize withdrawal symptoms ?Your body and mind may experience discomfort as you try to get used to not having nicotine in your system. These effects are called withdrawal symptoms. They may include: ?Feeling hungrier than normal. ?Having trouble concentrating. ?Feeling irritable or restless. ?Having trouble sleeping. ?Feeling depressed. ?Craving a cigarette. ?To manage withdrawal symptoms: ?Avoid places, people, and activities that trigger your cravings. ?Remember why you want to quit. ?Get plenty of sleep. ?Avoid coffee and other caffeinated drinks. These may worsen some of your symptoms. ?These symptoms may surprise you. But be assured that they are normal to have when quitting smoking. ?How to manage cravings ?Come up with a plan for how to deal with your cravings. The plan should include the following: ?A definition of the specific situation you want to deal with. ?An alternative action you will take. ?A clear idea for how this action  will help. ?The name of someone who might help you with this. ?Cravings usually last for 5-10 minutes. Consider taking the following actions to help you with your plan to deal with cravings: ?Keep your mouth busy. ?Chew sugar-free gum. ?Suck on hard candies or a straw. ?Brush your  teeth. ?Keep your hands and body busy. ?Change to a different activity right away. ?Squeeze or play with a ball. ?Do an activity or a hobby, such as making bead jewelry, practicing needlepoint, or working with wood. ?Mix up your normal routine. ?Take a short exercise break. Go for a quick walk or run up and down stairs. ?Focus on doing something kind or helpful for someone else. ?Call a friend or family member to talk during a craving. ?Join a support group. ?Contact a quitline. ?Where to find support ?To get help or find a support group: ?Call the [facility]'s Smoking Quitline: 1-800-QUIT NOW 778-365-3761) ?Visit the website of the Substance Abuse and Mental Health Services Administration: ktimeonline.com ?Text QUIT to SmokefreeTXT: 222979 ?Where to find more information ?Visit these websites to find more information on quitting smoking: ?[place]: www.smokefree.gov ?American Lung Association: www.lung.org ?American Cancer Society: www.cancer.org ?Centers for Disease Control and Prevention: http://www.wolf.info/ ?American Heart Association: www.heart.org ?Contact a health care provider if: ?You want to change your plan for quitting. ?The medicines you are taking are not helping. ?Your eating feels out of control or you cannot sleep. ?Get help right away if: ?You feel depressed or become very anxious. ?Summary ?Quitting smoking is a physical and mental challenge. You will face cravings, withdrawal symptoms, and temptation to smoke again. Preparation can help you as you go through these challenges. ?Try different techniques to manage stress, handle social situations, and prevent weight gain. ?You can deal with cravings by keeping your mouth busy (such as by chewing gum), keeping your hands and body busy, calling family or friends, or contacting a quitline for people who want to quit smoking. ?You can deal with withdrawal symptoms by avoiding places where people smoke, getting plenty of rest, and  avoiding drinks with caffeine. ?This information is not intended to replace advice given to you by your health care provider. Make sure you discuss any questions you have with your health care provider. ?Document Revised: 08/31/2020 Document Reviewed: 10/12/2018 ?Elsevier Patient Education ? [place]. ?Health Maintenance, Female ?Adopting a healthy lifestyle and getting preventive care are important in promoting health and wellness. Ask your health care provider about: ?The right schedule for you to have regular tests and exams. ?Things you can do on your own to prevent diseases and keep yourself healthy. ?What should I know about diet, weight, and exercise? ?Eat a healthy diet ? ?Eat a diet that includes plenty of vegetables, fruits, low-fat dairy products, and lean protein. ?Do not eat a lot of foods that are high in solid fats, added sugars, or sodium. ?Maintain a healthy weight ?Body mass index (BMI) is used to identify weight problems. It estimates body fat based on height and weight. Your health care provider can help determine your BMI and help you achieve or maintain a healthy weight. ?Get regular exercise ?Get regular exercise. This is one of the most important things you can do for your health. Most adults should: ?Exercise for at least 150 minutes each week. The exercise should increase your heart rate and make you sweat (moderate-intensity exercise). ?Do strengthening exercises at least twice a week. This is in addition to the moderate-intensity exercise. ?Spend less time  sitting. Even light physical activity can be beneficial. ?Watch cholesterol and blood lipids ?Have your blood tested for lipids and cholesterol at 48 years of age, then have this test every 5 years. ?Have your cholesterol levels checked more often if: ?Your lipid or cholesterol levels are high. ?You are older than 48 years of age. ?You are at high risk for heart disease. ?What should I know about cancer screening? ?Depending on  your health history and family history, you may need to have cancer screening at various ages. This may include screening for: ?Breast cancer. ?Cervical cancer. ?Colorectal cancer. ?Skin cancer. ?Lung cancer. ?

## 2021-04-17 NOTE — Progress Notes (Signed)
? ?Established Patient Office Visit ? ?Subjective:  ?Patient ID: Denise Mckinney, female    DOB: 05-27-1975  Age: 46 y.o. MRN: [id number] ? ?CC:  ?Chief Complaint  ?Patient presents with  ? Annual Exam  ?  Patient reports she has been having chest pain, several times a month   ? ? ?HPI ?Denise Mckinney presents for .   ?Encounter for general adult medical examination without abnormal findings  ?Physical: Patient's last physical exam was 1 year ago .  ?Weight: Appropriate for height (BMI less than 27%)   ?Blood Pressure: Normal (BP less than 120/80) ?Medical History: Patient history reviewed ; Family history reviewed ;  ?Allergies Reviewed: No change in current allergies ;  ?Medications Reviewed: Medications reviewed - no changes ;  ?Lipids: Normal lipid levels ; Labs completed results pending ?Smoking: Life-long-smoker ;  ?Physical Activity: Exercises at least 3 times per week as tolerated ?Alcohol/Drug Use: Is a non-drinker ; No illicit drug use ;  ?Patient is not afflicted from Stress Incontinence and Urge Incontinence  ?Safety: reviewed ; Patient wears a seat belt, has smoke detectors, has carbon monoxide detectors, practices appropriate gun safety, and wears sunscreen with extended sun exposure. ?Dental Care: biannual cleanings, brushes and flosses daily. ?Ophthalmology/Optometry: Annual visit.  ?Hearing loss: left ear ?Vision impairments: wears prescription glasses ? ?Smoking cessation instruction/counseling given:  counseled patient on the dangers of tobacco use, advised patient to stop smoking, and reviewed strategies to maximize success  ? ?Past Medical History:  ?Diagnosis Date  ? Hearing loss   ? Migraines   ? ? ?Past Surgical History:  ?Procedure Laterality Date  ? 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 CHOLECYSTECTOMY    ? DILATION AND CURETTAGE OF UTERUS    ? pins in right ring and small fingers    ? TONSILLECTOMY AND ADENOIDECTOMY    ? tubes in ears    ? tumor removed under right eye    ? ? ?Family  History  ?Problem Relation Age of Onset  ? Heart disease Mother   ? Hyperlipidemia Mother   ? Hypertension Mother   ? Diabetes Mother   ? Hypertension Father   ? Cancer Father   ?     lung and esophagus  ? Diabetes Sister   ? Heart disease Sister   ? Bipolar disorder Daughter   ? ? ?Social History  ? ?Socioeconomic History  ? Marital status: Single  ?  Spouse name: Not on file  ? Number of children: 3  ? Years of education: Not on file  ? Highest education level: Some college, no degree  ?Occupational History  ? Not on file  ?Tobacco Use  ? Smoking status: Every Day  ?  Packs/day: 0.50  ?  Years: 36.00  ?  Pack years: 18.00  ?  Types: Cigarettes  ? Smokeless tobacco: Never  ?Vaping Use  ? Vaping Use: Never used  ?Substance and Sexual Activity  ? Alcohol use: Yes  ?  Comment: occ  ? Drug use: Never  ? Sexual activity: Not Currently  ?Other Topics Concern  ? Not on file  ?Social History Narrative  ? Not on file  ? ?Social Determinants of Health  ? ?Financial Resource Strain: Not on file  ?Food Insecurity: Not on file  ?Transportation Needs: Not on file  ?Physical Activity: Not on file  ?Stress: Not on file  ?Social Connections: Not on file  ?Intimate Partner Violence: Not on file  ? ? ?Outpatient  Medications Prior to Visit  ?Medication Sig Dispense Refill  ? ibuprofen (ADVIL) 600 MG tablet Take 1 tablet (600 mg total) by mouth every 8 (eight) hours as needed. 30 tablet 0  ? naproxen (NAPROSYN) 500 MG tablet Take 1 tablet (500 mg total) by mouth 2 (two) times daily with a meal. 60 tablet 5  ? predniSONE (STERAPRED UNI-PAK 21 TAB) 10 MG (21) TBPK tablet 6 tablet day1, 5 tablet day 2, 4 tablet day 3, 3 tablet day 4, 2 tablet day 5, 1 tablet day 6 1 each 0  ? ?No facility-administered medications prior to visit.  ? ? ?No Known Allergies ? ?ROS ?Review of Systems  ?Constitutional: Negative.   ?HENT: Negative.    ?Eyes: Negative.   ?Respiratory: Negative.    ?Cardiovascular: Negative.   ?Gastrointestinal: Negative.    ?Genitourinary: Negative.   ?Musculoskeletal: Negative.   ?Skin: Negative.  Negative for rash.  ?Neurological: Negative.   ?Psychiatric/Behavioral: Negative.    ?All other systems reviewed and are negative. ? ?  ?Objective:  ?  ?Physical Exam ?Vitals and nursing note reviewed.  ?Constitutional:   ?   Appearance: Normal appearance.  ?HENT:  ?   Head: Normocephalic.  ?   Right Ear: External ear normal.  ?   Left Ear: External ear normal.  ?   Nose: Nose normal.  ?   Mouth/Throat:  ?   Mouth: Mucous membranes are moist.  ?   Pharynx: Oropharynx is clear.  ?Eyes:  ?   Conjunctiva/sclera: Conjunctivae normal.  ?   Pupils: Pupils are equal, round, and reactive to light.  ?Cardiovascular:  ?   Pulses: Normal pulses.  ?Pulmonary:  ?   Effort: Pulmonary effort is normal.  ?Abdominal:  ?   General: Bowel sounds are normal.  ?Musculoskeletal:     ?   General: Normal range of motion.  ?Skin: ?   General: Skin is warm.  ?   Findings: No rash.  ?Neurological:  ?   General: No focal deficit present.  ?   Mental Status: She is alert and oriented to person, place, and time.  ?Psychiatric:     ?   Mood and Affect: Mood normal.     ?   Behavior: Behavior normal.  ? ? ?BP 121/72   Pulse 65   Temp 98.2 ?F (36.8 ?C)   Resp 18   Ht _0  (1.575 m)   Wt 117 lb 9.6 oz (53.3 kg)   SpO2 96%   BMI 21.51 kg/m?  ?Wt Readings from Last 3 Encounters:  ?04/17/21 117 lb 9.6 oz (53.3 kg)  ?02/06/21 118 lb (53.5 kg)  ?09/11/20 124 lb (56.2 kg)  ? ? ? ?There are no preventive care reminders to display for this patient. ? ?There are no preventive care reminders to display for this patient. ? ?No results found for: TSH ?Lab Results  ?Component Value Date  ? WBC 7.2 03/09/2020  ? HGB 16.1 (H) 03/09/2020  ? HCT 48.1 (H) 03/09/2020  ? MCV 94 03/09/2020  ? PLT 177 03/09/2020  ? ?Lab Results  ?Component Value Date  ? NA 140 03/09/2020  ? K 3.9 03/09/2020  ? CO2 25 03/09/2020  ? GLUCOSE 76 03/09/2020  ? BUN 12 03/09/2020  ? CREATININE 0.81 03/09/2020   ? BILITOT 0.4 03/09/2020  ? ALKPHOS 105 03/09/2020  ? AST 20 03/09/2020  ? ALT 12 03/09/2020  ? PROT 6.6 03/09/2020  ? ALBUMIN 4.4 03/09/2020  ? CALCIUM 9.3 03/09/2020  ?  EGFR 91 03/09/2020  ? ?Lab Results  ?Component Value Date  ? CHOL 187 03/09/2020  ? ?Lab Results  ?Component Value Date  ? HDL 51 03/09/2020  ? ?Lab Results  ?Component Value Date  ? LDLCALC 121 (H) 03/09/2020  ? ?Lab Results  ?Component Value Date  ? TRIG 82 03/09/2020  ? ?Lab Results  ?Component Value Date  ? CHOLHDL 3.7 03/09/2020  ? ?No results found for: HGBA1C ? ?  ?Assessment & Plan:  ? ?Problem List Items Addressed This Visit   ? ?  ? Other  ? Annual physical exam - Primary  ? Relevant Orders  ? CBC with Differential  ? Comprehensive metabolic panel  ? Lipid Panel  ? ? ?No orders of the defined types were placed in this encounter. ? ? ?Follow-up: Return in about 1 year (around 04/18/2022) for Annual physical.  ? ? ?Ivy Lynn, NP ?

## 2023-07-01 IMAGING — US US TRANSVAGINAL NON-OB
1 series · 13 of 25 positions shown · non-contrast
Comparison: None.

CLINICAL DATA: Initial evaluation for vaginal bleeding for 1 month.
Per provided history, patient is postmenopausal.

EXAM:
ULTRASOUND PELVIS TRANSVAGINAL
TECHNIQUE: Transvaginal ultrasound examination of the pelvis was performed
including evaluation of the uterus, ovaries, adnexal regions, and
pelvic cul-de-sac.

[Series 1: us pelvis transvaginal non-ob (tv only) · 13 of 68 slices shown]
[im 1/68]
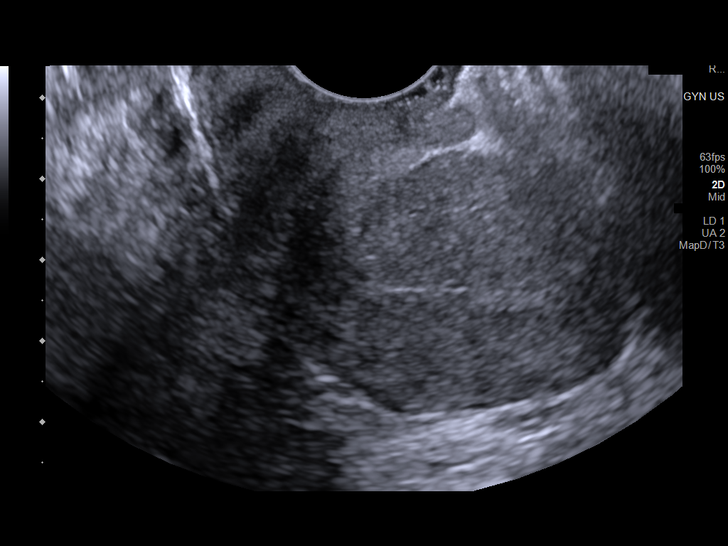
[im 6/68]
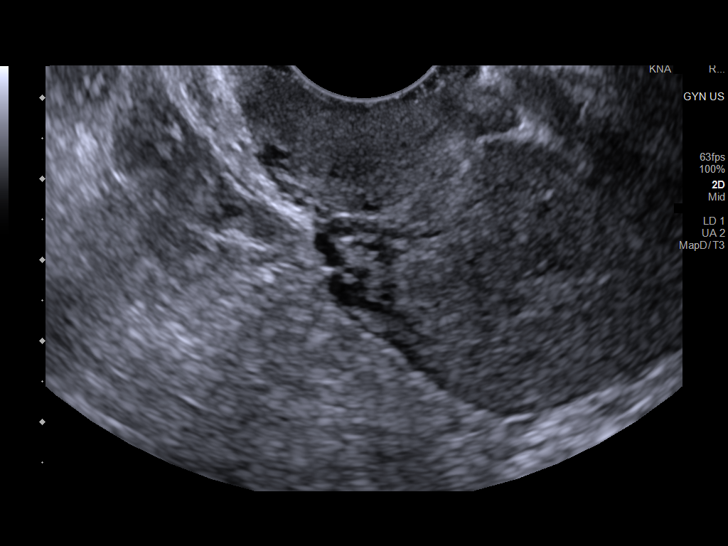
[im 12/68]
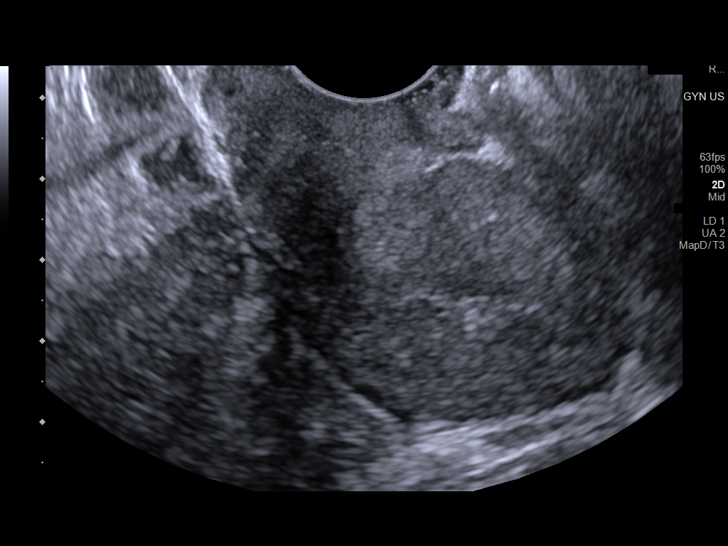
[im 17/68]
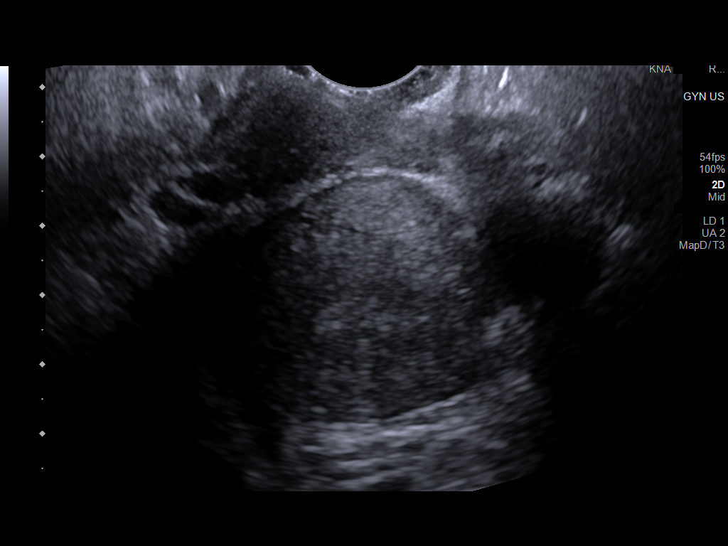
[im 23/68]
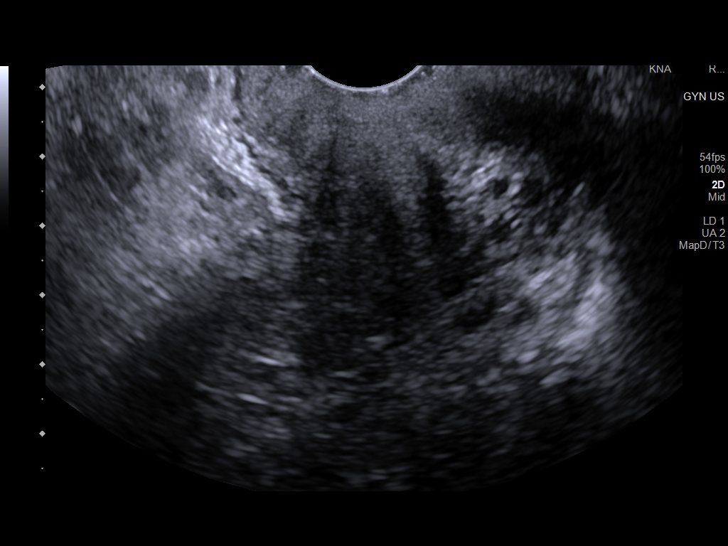
[im 28/68]
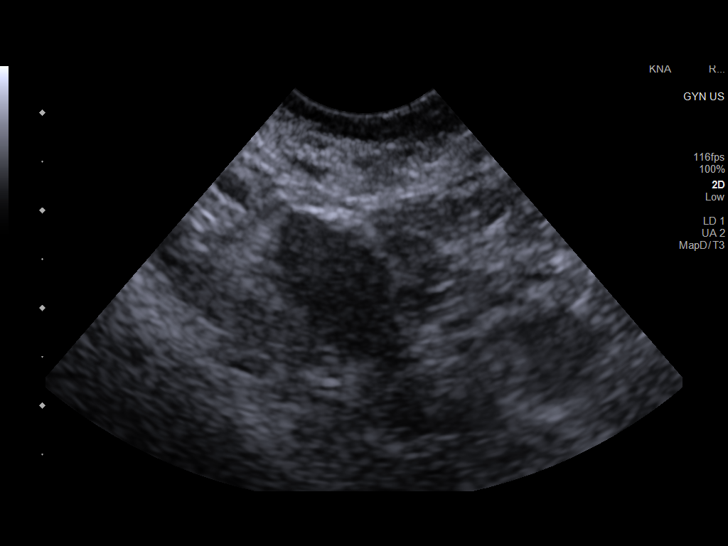
[im 34/68]
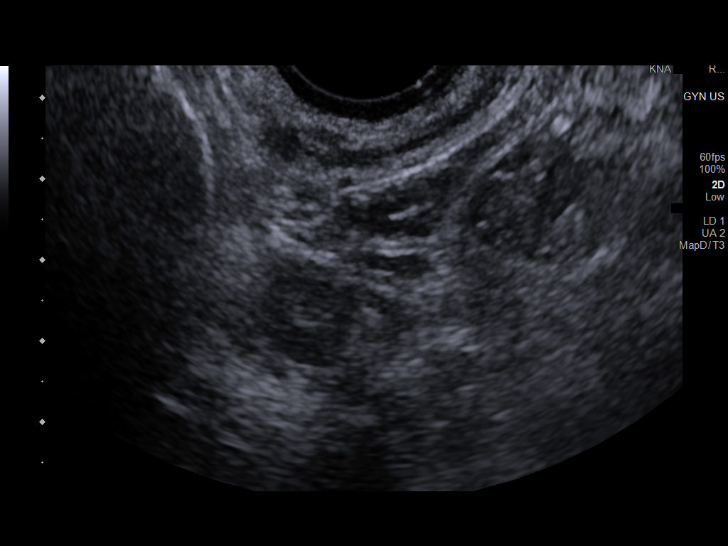
[im 40/68]
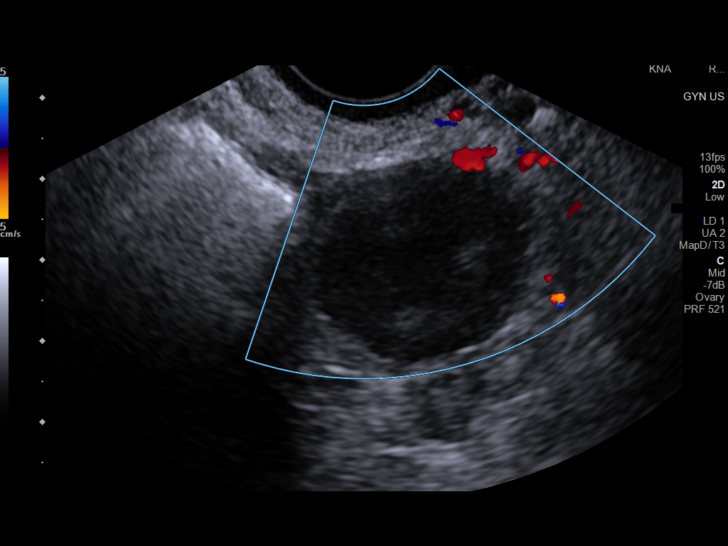
[im 45/68]
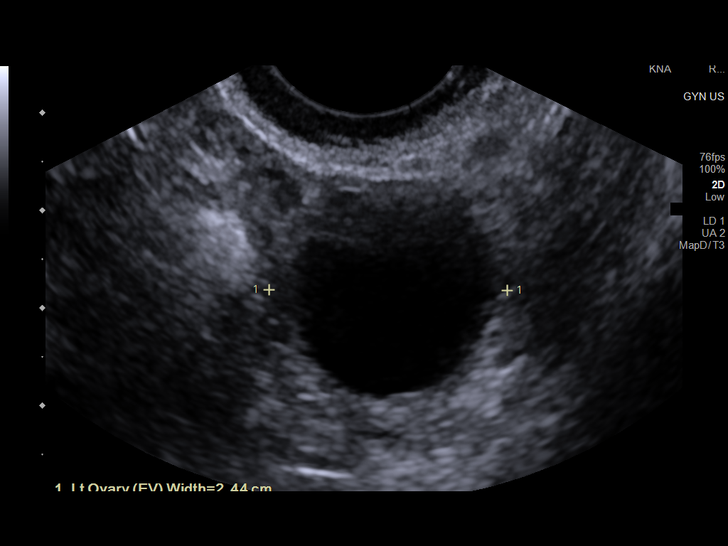
[im 51/68]
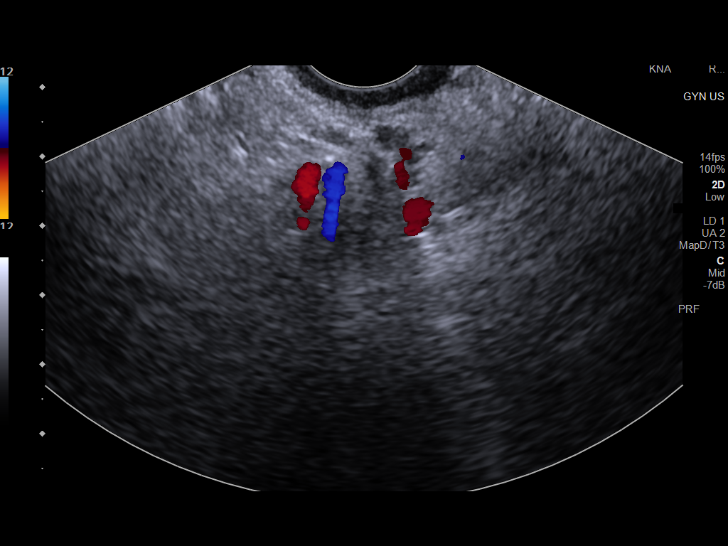
[im 56/68]
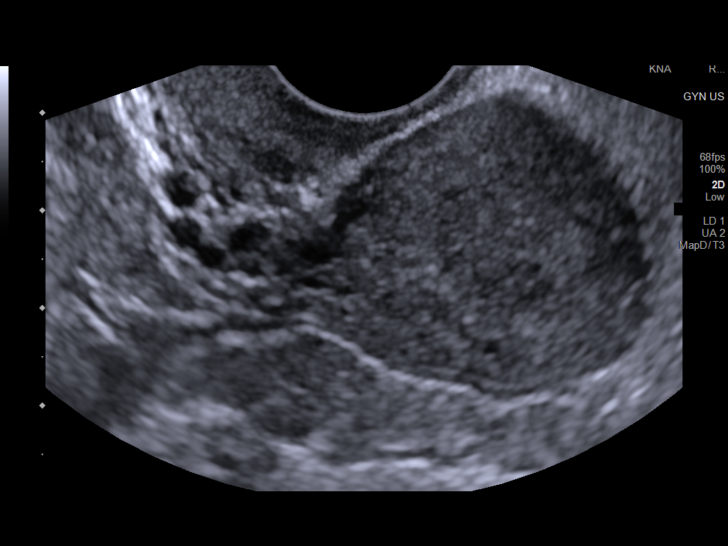
[im 62/68]
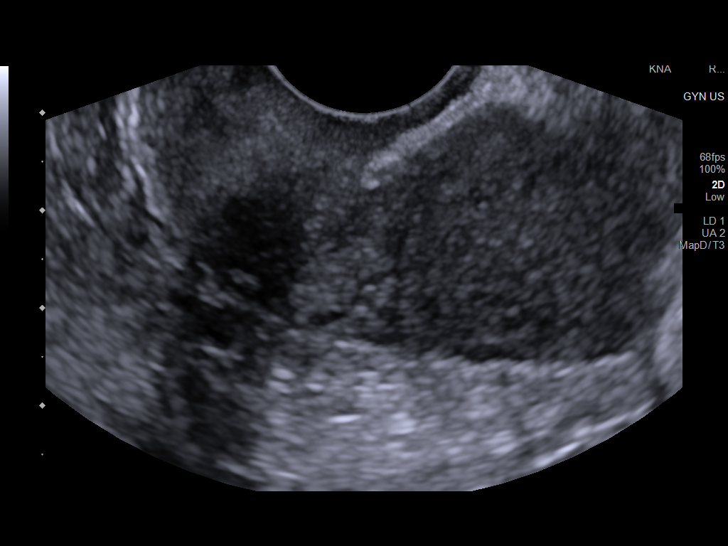
[im 68/68]
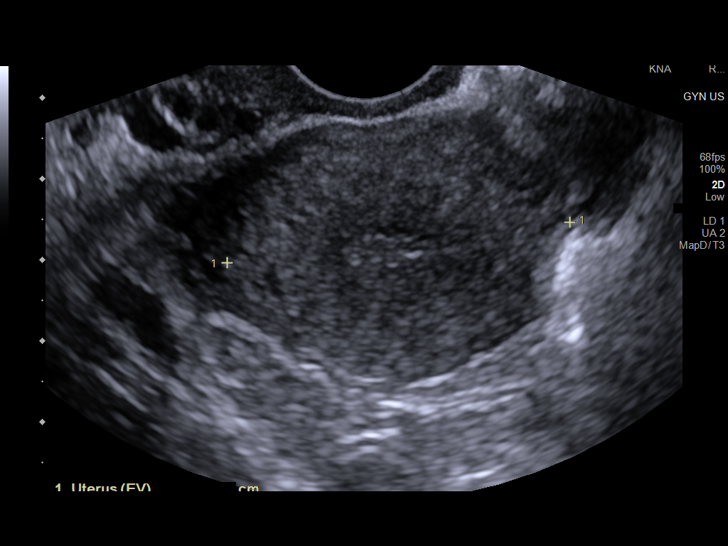

[13 of 25 positions shown; findings below may reference images not displayed]

FINDINGS: Uterus

Measurements: 5.6 x 3.1 x 4.3 cm = volume: 38.3 mL. Uterus is
retroverted. No discrete fibroid or other myometrial abnormality.

Endometrium

Thickness: 1.4 mm.  No focal abnormality visualized.

Right ovary

Measurements: 2.6 x 1.6 x 1.2 cm = volume: 2.4 mL. Normal
appearance/no adnexal mass.

Left ovary

Measurements: 2.8 x 2.4 x 2.4 cm = volume: 8.6 mL. 2.7 x 2.3 x
cm cystic lesion seen arising from the left ovary. Cyst is largely
simple in appearance without significant internal complexity. No
vascularity or solid nodularity.

Other findings:  No abnormal free fluid
IMPRESSION: 1. Endometrial stripe is thin measuring 1.4 mm in thickness. In the
setting of post-menopausal bleeding, this is consistent with a
benign etiology such as endometrial atrophy. If bleeding remains
unresponsive to hormonal or medical therapy, sonohysterogram should
be considered for focal lesion work-up. (Ref: Radiological
Reasoning: Algorithmic Workup of Abnormal Vaginal Bleeding with
Endovaginal Sonography and Sonohysterography. AJR [id number]; 191:[id number]).
2. Normal sonographic appearance of the uterus.
3. 2.7 cm simple left ovarian cyst, almost certainly benign given
size and appearance. No followup imaging recommended. Note: This
recommendation does not apply to premenarchal patients or to those
with increased risk (genetic, family history, elevated tumor markers
or other high-risk factors) of ovarian cancer. Reference: Radiology
[DATE]):[id number].
4. Normal right ovary.  No other adnexal mass or free fluid.

## 2023-10-31 IMAGING — DX DG SHOULDER 2+V*R*
3 series · 3 of 3 positions shown · non-contrast
Comparison: None.

CLINICAL DATA: Chronic RIGHT shoulder pain twenty years. No known
injury. Initial encounter.

EXAM:
RIGHT SHOULDER - 2+ VIEW

[shoulder obl]
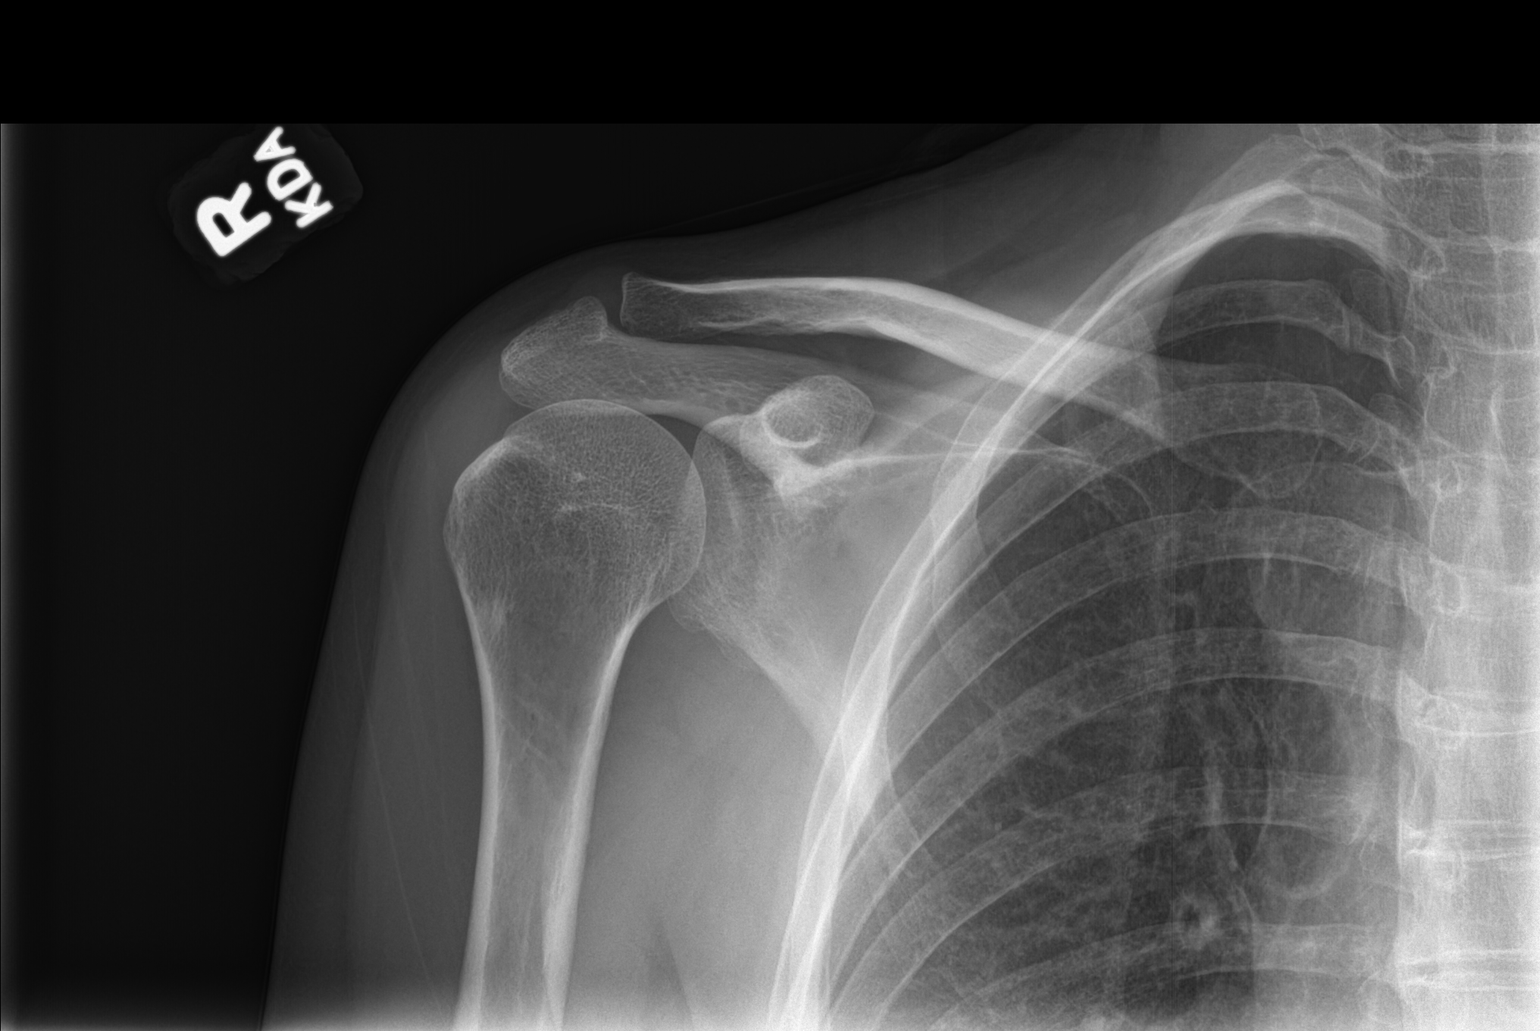

[shoulder axial]
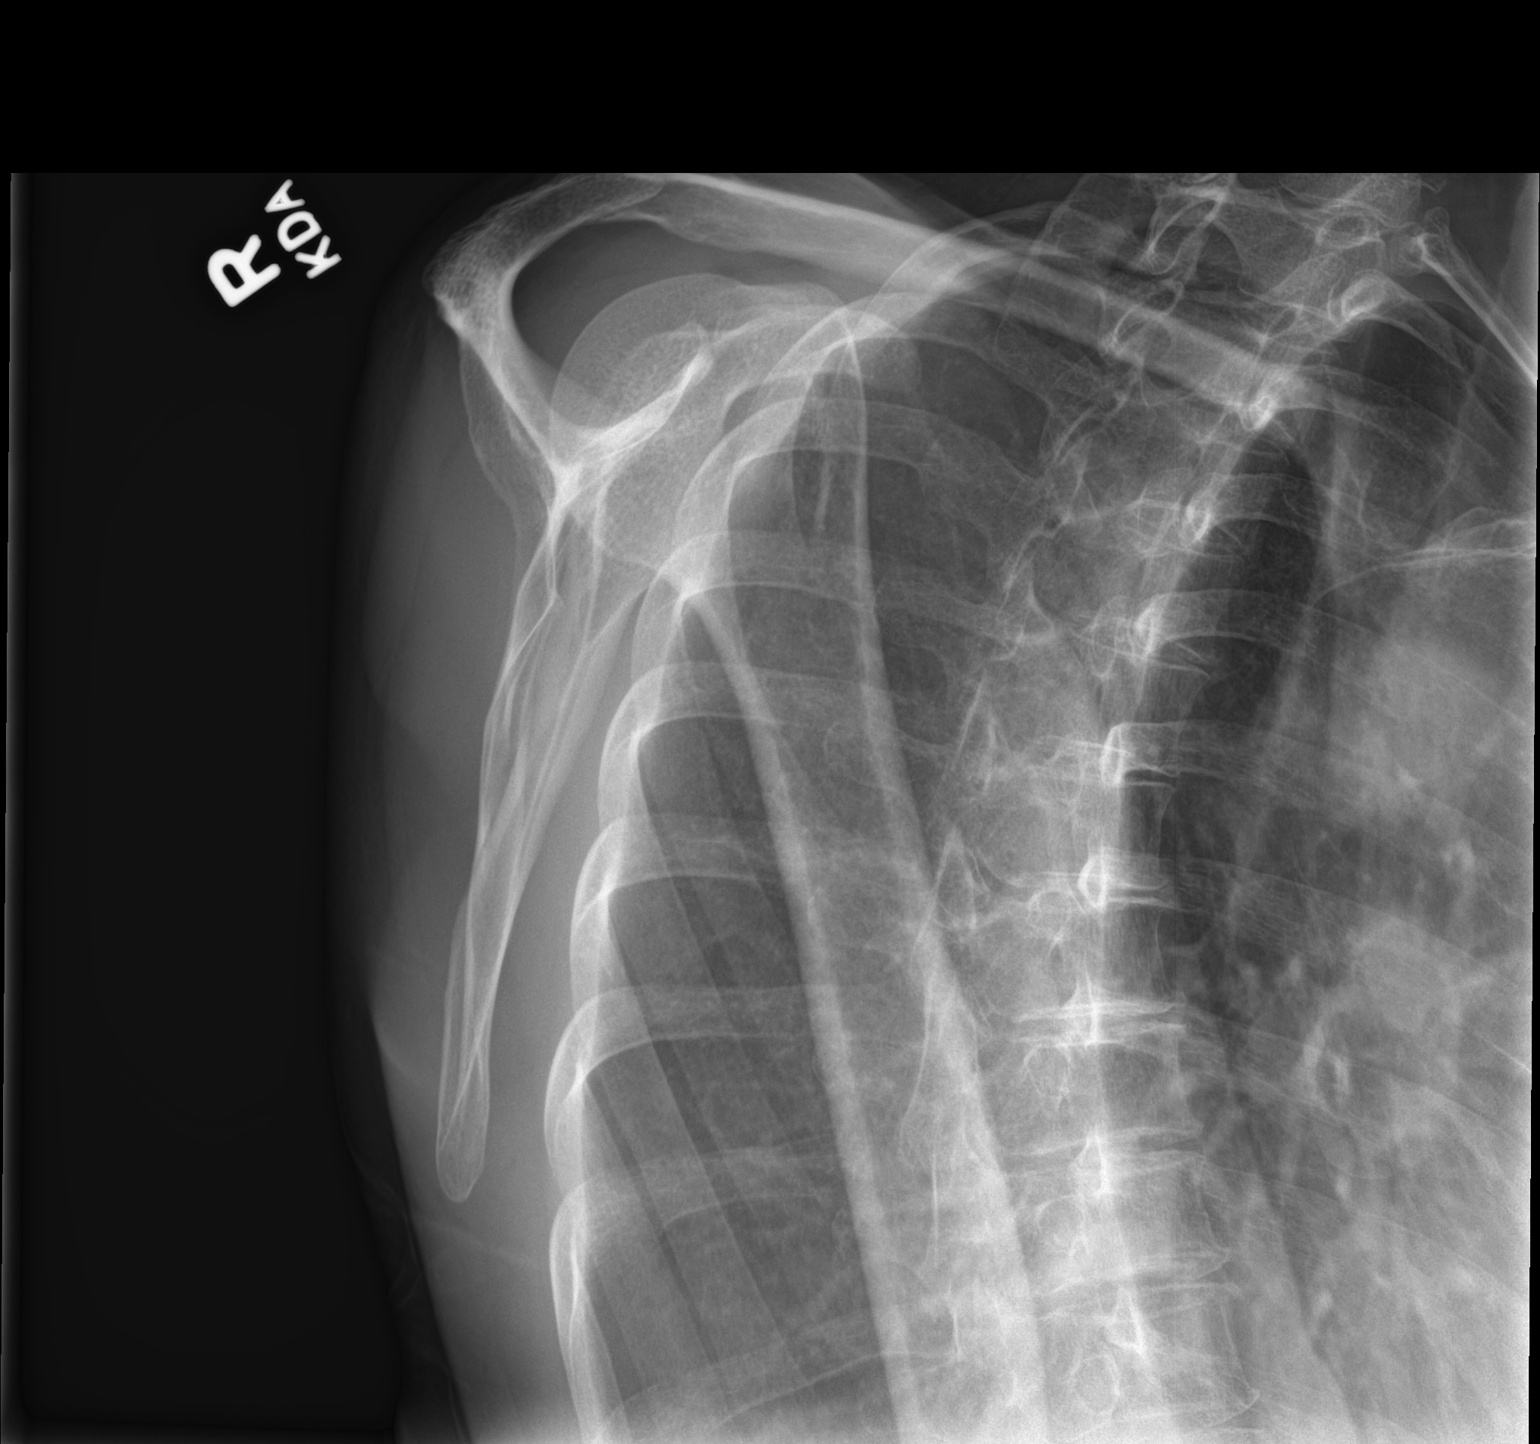

[shoulder swimmer]
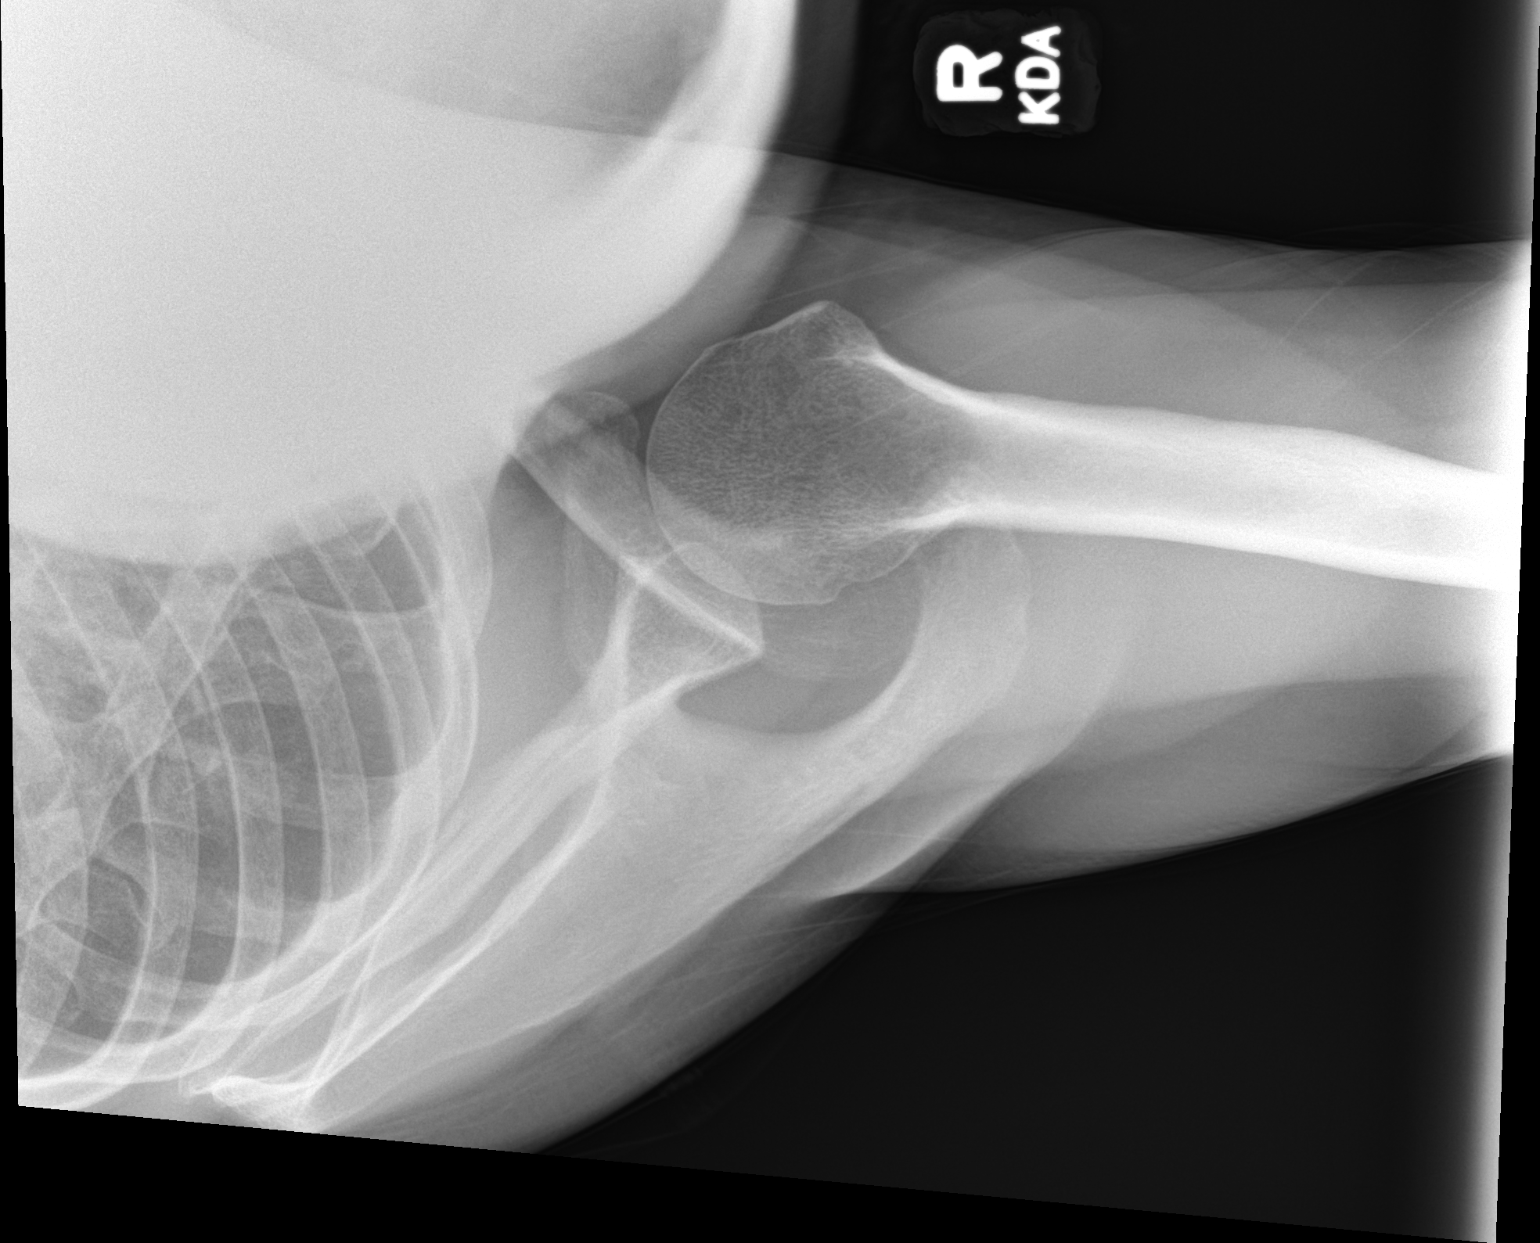

[3 of 3 positions shown; findings below may reference images not displayed]

FINDINGS: There is no evidence of fracture or dislocation. There is no
evidence of arthropathy or other focal bone abnormality. Soft
tissues are unremarkable.
IMPRESSION: Negative.
